# Patient Record
Sex: Female | Born: 1960 | Race: White | Hispanic: No | Marital: Married | Smoking: Never smoker
Health system: Southern US, Community
[De-identification: ages and names within clinical notes are randomized; demographics above are authoritative.]

## PROBLEM LIST (undated history)

## (undated) DIAGNOSIS — C801 Malignant (primary) neoplasm, unspecified: Secondary | ICD-10-CM

## (undated) DIAGNOSIS — Z860100 Personal history of colon polyps, unspecified: Secondary | ICD-10-CM

## (undated) DIAGNOSIS — E079 Disorder of thyroid, unspecified: Secondary | ICD-10-CM

## (undated) DIAGNOSIS — R42 Dizziness and giddiness: Secondary | ICD-10-CM

## (undated) DIAGNOSIS — D039 Melanoma in situ, unspecified: Secondary | ICD-10-CM

## (undated) HISTORY — DX: Dizziness and giddiness: R42

## (undated) HISTORY — DX: Disorder of thyroid, unspecified: E07.9

## (undated) HISTORY — DX: Personal history of colon polyps, unspecified: Z86.0100

## (undated) HISTORY — DX: Melanoma in situ, unspecified: D03.9

## (undated) HISTORY — DX: Malignant (primary) neoplasm, unspecified: C80.1

## (undated) HISTORY — DX: Personal history of colonic polyps: Z86.010

---
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
## 1992-11-15 HISTORY — PX: TUBAL LIGATION: SHX77

## 2005-05-31 ENCOUNTER — Ambulatory Visit: Payer: Self-pay | Admitting: Obstetrics and Gynecology

## 2006-06-17 ENCOUNTER — Ambulatory Visit: Payer: Self-pay | Admitting: Obstetrics and Gynecology

## 2007-03-14 ENCOUNTER — Ambulatory Visit: Payer: Self-pay | Admitting: Family Medicine

## 2008-03-08 ENCOUNTER — Ambulatory Visit: Payer: Self-pay

## 2009-08-22 ENCOUNTER — Ambulatory Visit: Payer: Self-pay | Admitting: Internal Medicine

## 2010-11-17 ENCOUNTER — Ambulatory Visit: Payer: Self-pay | Admitting: Family Medicine

## 2012-04-14 ENCOUNTER — Ambulatory Visit: Payer: Self-pay | Admitting: Family Medicine

## 2012-05-16 ENCOUNTER — Ambulatory Visit: Payer: Self-pay | Admitting: Unknown Physician Specialty

## 2013-02-02 ENCOUNTER — Ambulatory Visit (INDEPENDENT_AMBULATORY_CARE_PROVIDER_SITE_OTHER): Payer: PRIVATE HEALTH INSURANCE | Admitting: Internal Medicine

## 2013-02-02 ENCOUNTER — Other Ambulatory Visit (HOSPITAL_COMMUNITY)
Admission: RE | Admit: 2013-02-02 | Discharge: 2013-02-02 | Disposition: A | Discharge status: Home / Self Care | Payer: PRIVATE HEALTH INSURANCE | Source: Ambulatory Visit | Attending: Internal Medicine | Admitting: Internal Medicine

## 2013-02-02 ENCOUNTER — Ambulatory Visit: Payer: PRIVATE HEALTH INSURANCE

## 2013-02-02 ENCOUNTER — Encounter: Payer: Self-pay | Admitting: Internal Medicine

## 2013-02-02 VITALS — BP 112/80 | HR 64 | Temp 97.9°F | Ht 63.5 in | Wt 142.5 lb

## 2013-02-02 DIAGNOSIS — Z01419 Encounter for gynecological examination (general) (routine) without abnormal findings: Secondary | ICD-10-CM | POA: Insufficient documentation

## 2013-02-02 DIAGNOSIS — Z13220 Encounter for screening for lipoid disorders: Secondary | ICD-10-CM

## 2013-02-02 DIAGNOSIS — D039 Melanoma in situ, unspecified: Secondary | ICD-10-CM

## 2013-02-02 DIAGNOSIS — Z124 Encounter for screening for malignant neoplasm of cervix: Secondary | ICD-10-CM

## 2013-02-02 DIAGNOSIS — C439 Malignant melanoma of skin, unspecified: Secondary | ICD-10-CM

## 2013-02-02 DIAGNOSIS — Z1151 Encounter for screening for human papillomavirus (HPV): Secondary | ICD-10-CM | POA: Insufficient documentation

## 2013-02-02 DIAGNOSIS — Z86010 Personal history of colonic polyps: Secondary | ICD-10-CM

## 2013-02-02 LAB — CBC WITH DIFFERENTIAL/PLATELET
Basophils Absolute: 0 10*3/uL (ref 0.0–0.1)
Eosinophils Absolute: 0.1 10*3/uL (ref 0.0–0.7)
Lymphocytes Relative: 30 % (ref 12–46)
Lymphs Abs: 1.7 10*3/uL (ref 0.7–4.0)
Neutrophils Relative %: 62 % (ref 43–77)
Platelets: 171 10*3/uL (ref 150–400)
RBC: 4.78 MIL/uL (ref 3.87–5.11)
WBC: 5.7 10*3/uL (ref 4.0–10.5)

## 2013-02-02 LAB — COMPREHENSIVE METABOLIC PANEL
ALT: 24 U/L (ref 0–35)
Albumin: 4.6 g/dL (ref 3.5–5.2)
CO2: 28 mEq/L (ref 19–32)
Chloride: 103 mEq/L (ref 96–112)
GFR: 62.2 mL/min (ref >60.00)
Glucose, Bld: 84 mg/dL (ref 70–99)
Potassium: 4.3 mEq/L (ref 3.5–5.1)
Sodium: 138 mEq/L (ref 135–145)
Total Bilirubin: 0.9 mg/dL (ref 0.3–1.2)
Total Protein: 7.8 g/dL (ref 6.0–8.3)

## 2013-02-02 LAB — TSH: TSH: 3.04 u[IU]/mL (ref 0.35–5.50)

## 2013-02-02 LAB — LDL CHOLESTEROL, DIRECT: Direct LDL: 160.6 mg/dL

## 2013-02-02 LAB — LIPID PANEL: HDL: 63.5 mg/dL (ref >39.00)

## 2013-02-04 ENCOUNTER — Encounter: Payer: Self-pay | Admitting: Internal Medicine

## 2013-02-04 DIAGNOSIS — D039 Melanoma in situ, unspecified: Secondary | ICD-10-CM | POA: Insufficient documentation

## 2013-02-04 DIAGNOSIS — Z86010 Personal history of colonic polyps: Secondary | ICD-10-CM | POA: Insufficient documentation

## 2013-02-04 NOTE — Assessment & Plan Note (Signed)
Followed by Dr Graham.  

## 2013-02-04 NOTE — Progress Notes (Signed)
  Subjective:    Patient ID: Michelle Roach, female    DOB: 1961-07-19, 52 y.o.   MRN: [id number]  HPI 52 year old female with past history of melanoma in situ and colon polyps who comes in today to establish care and for her complete physical exam.  (daughter of Francesca Jewett).  She has previously been seeing Dr Quillian Quince.  States she has always had normal pap smears.  Due a physical.  Last mammo - fall 2013.  Followed by Dr Cheree Ditto for her melanoma in situ.  Stays active.  Works as a Armed forces operational officer.  No cardiac symptoms with increased activity or exertion.  No nausea or vomiting.  Bowels stable.  No period for four years.  Having some hot flashes.  Manages.  Exercises.     Past Medical History  Diagnosis Date  . Melanoma in situ     right leg  . History of colon polyps     Outpatient Encounter Prescriptions as of 02/02/2013  Medication Sig Dispense Refill  . aspirin 81 MG tablet Take 81 mg by mouth daily.       No facility-administered encounter medications on file as of 02/02/2013.    Review of Systems Patient denies any headache, lightheadedness or dizziness.  No sinus or allergy symptoms.  No chest pain, tightness or palpitations.  No increased shortness of breath, cough or congestion.  No nausea or vomiting.  No acid reflux.  No abdominal pain or cramping.  No bowel change, such as diarrhea, constipation, BRBPR or melana.  No urine change.  No vaginal bleeding or problems.  No period for four years.  Some hot flashes.       Objective:   Physical Exam Filed Vitals:   02/02/13 1023  BP: 112/80  Pulse: 64  Temp: 97.9 F (73.41 C)   52 year old female in no acute distress.   HEENT:  Nares- clear.  Oropharynx - without lesions. NECK:  Supple.  Nontender.  No audible bruit.  HEART:  Appears to be regular. LUNGS:  No crackles or wheezing audible.  Respirations even and unlabored.  RADIAL PULSE:  Equal bilaterally.    BREASTS:  No nipple discharge or nipple retraction present.  Could  not appreciate any distinct nodules or axillary adenopathy.  ABDOMEN:  Soft, nontender.  Bowel sounds present and normal.  No audible abdominal bruit.  GU:  Normal external genitalia.  Vaginal vault without lesions.  S/p hysterectomy.  Could not appreciate any adnexal masses or tenderness.   RECTAL:  Heme negative.   EXTREMITIES:  No increased edema present.  DP pulses palpable and equal bilaterally.          Assessment & Plan:  CARDIOVASCULAR.  Active with no cardiac symptoms.  Follow.   MENOPAUSAL SYNDROME.  Some hot flashes.  Symptoms manageable .  Follow.   HEALTH MAINTENANCE.  Physical today.  Obtain records from Dr Quillian Quince.  Up to date with mammograms.  Colonoscopy 11/13 - polyps.  Due follow up colonoscopy 2016.

## 2013-02-04 NOTE — Assessment & Plan Note (Signed)
Colonoscopy 09/2012 with pre cancerous polyps.  Recommended follow up colonoscopy in three years.    

## 2013-02-06 ENCOUNTER — Other Ambulatory Visit: Payer: Self-pay | Admitting: Internal Medicine

## 2013-02-06 DIAGNOSIS — E7800 Pure hypercholesterolemia, unspecified: Secondary | ICD-10-CM

## 2013-02-06 NOTE — Progress Notes (Signed)
Order placed for a follow up cholesterol check.

## 2013-02-12 ENCOUNTER — Encounter: Payer: Self-pay | Admitting: Internal Medicine

## 2013-02-12 DIAGNOSIS — Z86010 Personal history of colonic polyps: Secondary | ICD-10-CM

## 2013-06-06 ENCOUNTER — Telehealth: Payer: Self-pay | Admitting: Internal Medicine

## 2013-06-06 DIAGNOSIS — R238 Other skin changes: Secondary | ICD-10-CM

## 2013-06-06 NOTE — Telephone Encounter (Signed)
Pt notified & states that a previous doctor put her on ASA. She has not taking it in the last 2 days.

## 2013-06-06 NOTE — Telephone Encounter (Signed)
I have ordered the labs.  Per chart, she is on 81mg  aspirin.  Why is she taking this?  This possibly could be causing more bruising.

## 2013-06-06 NOTE — Telephone Encounter (Signed)
Pt is coming in for labs on Friday and is having unusal bruising that keeps coming up from no where. She was wondering if more labs could be done to see whats going on ??

## 2013-06-08 ENCOUNTER — Other Ambulatory Visit (INDEPENDENT_AMBULATORY_CARE_PROVIDER_SITE_OTHER): Payer: PRIVATE HEALTH INSURANCE

## 2013-06-08 DIAGNOSIS — R238 Other skin changes: Secondary | ICD-10-CM

## 2013-06-08 DIAGNOSIS — E7800 Pure hypercholesterolemia, unspecified: Secondary | ICD-10-CM

## 2013-06-08 LAB — CBC WITH DIFFERENTIAL/PLATELET
Basophils Relative: 0.6 % (ref 0.0–3.0)
Eosinophils Absolute: 0.1 10*3/uL (ref 0.0–0.7)
HCT: 42 % (ref 36.0–46.0)
Lymphs Abs: 1.5 10*3/uL (ref 0.7–4.0)
MCHC: 33.1 g/dL (ref 30.0–36.0)
MCV: 88.7 fl (ref 78.0–100.0)
Monocytes Absolute: 0.4 10*3/uL (ref 0.1–1.0)
Neutrophils Relative %: 61 % (ref 43.0–77.0)
RBC: 4.74 Mil/uL (ref 3.87–5.11)

## 2013-06-08 LAB — LIPID PANEL
HDL: 59.3 mg/dL (ref >39.00)
Triglycerides: 84 mg/dL (ref 0.0–149.0)

## 2013-06-08 LAB — LDL CHOLESTEROL, DIRECT: Direct LDL: 147 mg/dL

## 2013-06-08 LAB — PROTIME-INR
INR: 1.1 ratio — ABNORMAL HIGH (ref 0.8–1.0)
Prothrombin Time: 11.4 s (ref 10.2–12.4)

## 2013-06-14 ENCOUNTER — Encounter: Payer: Self-pay | Admitting: Internal Medicine

## 2013-06-15 ENCOUNTER — Other Ambulatory Visit: Payer: Self-pay | Admitting: Internal Medicine

## 2013-06-15 ENCOUNTER — Telehealth: Payer: Self-pay | Admitting: Internal Medicine

## 2013-06-15 ENCOUNTER — Encounter: Payer: Self-pay | Admitting: Internal Medicine

## 2013-06-15 DIAGNOSIS — R238 Other skin changes: Secondary | ICD-10-CM

## 2013-06-15 NOTE — Progress Notes (Signed)
Orders placed for ptt and von wilebrand panel

## 2013-06-15 NOTE — Telephone Encounter (Signed)
Appointment 8/20 sent pt my chart message letting her know about appointment

## 2013-06-15 NOTE — Telephone Encounter (Signed)
Pt notified of lab results and need for f/u non fasting lab within the next two weeks.  Please call and schedule her an appt for a non fasting lab wtihin the next two weeks.  Thanks.

## 2013-07-04 ENCOUNTER — Other Ambulatory Visit: Payer: PRIVATE HEALTH INSURANCE

## 2013-07-13 ENCOUNTER — Other Ambulatory Visit (INDEPENDENT_AMBULATORY_CARE_PROVIDER_SITE_OTHER): Payer: PRIVATE HEALTH INSURANCE

## 2013-07-13 DIAGNOSIS — R238 Other skin changes: Secondary | ICD-10-CM

## 2013-07-13 LAB — APTT: aPTT: 31.8 s — ABNORMAL HIGH (ref 21.7–28.8)

## 2013-07-17 LAB — VON WILLEBRAND PANEL
Coagulation Factor VIII: 46 % — ABNORMAL LOW (ref 73–140)
Ristocetin Co-factor, Plasma: 156 % (ref 42–200)
Von Willebrand Antigen, Plasma: 154 % (ref 50–217)

## 2013-07-26 ENCOUNTER — Telehealth: Payer: Self-pay | Admitting: *Deleted

## 2013-07-26 DIAGNOSIS — R791 Abnormal coagulation profile: Secondary | ICD-10-CM

## 2013-07-26 NOTE — Telephone Encounter (Signed)
Pt states that she was returning my call yesterday. I have not called her. Did you call her? Could be about the Von willebrand panel. Let me know if I need to call her

## 2013-07-26 NOTE — Telephone Encounter (Signed)
Pt notified of lab results.  Discussed with hematology (Dr Orlie Dakin).  Will refer to hematology.  Order for referral made.

## 2013-08-10 ENCOUNTER — Ambulatory Visit: Payer: Self-pay | Admitting: Oncology

## 2013-08-10 LAB — CBC CANCER CENTER
Basophil #: 0 x10 3/mm (ref 0.0–0.1)
Eosinophil #: 0.1 x10 3/mm (ref 0.0–0.7)
HGB: 13.9 g/dL (ref 12.0–16.0)
MCH: 29.7 pg (ref 26.0–34.0)
MCV: 89 fL (ref 80–100)
Monocyte #: 0.4 x10 3/mm (ref 0.2–0.9)
Monocyte %: 5.8 %
Neutrophil #: 5.7 x10 3/mm (ref 1.4–6.5)
Neutrophil %: 73.7 %
Platelet: 190 x10 3/mm (ref 150–440)
RBC: 4.68 10*6/uL (ref 3.80–5.20)

## 2013-08-10 LAB — PROTIME-INR: Prothrombin Time: 13 secs (ref 11.5–14.7)

## 2013-08-15 ENCOUNTER — Ambulatory Visit: Payer: Self-pay | Admitting: Oncology

## 2013-09-20 ENCOUNTER — Other Ambulatory Visit: Payer: Self-pay

## 2013-10-19 ENCOUNTER — Ambulatory Visit: Payer: Self-pay | Admitting: Internal Medicine

## 2013-10-22 ENCOUNTER — Encounter: Payer: Self-pay | Admitting: Internal Medicine

## 2013-12-04 ENCOUNTER — Encounter: Payer: Self-pay | Admitting: Internal Medicine

## 2013-12-07 ENCOUNTER — Telehealth: Payer: Self-pay | Admitting: *Deleted

## 2013-12-07 NOTE — Telephone Encounter (Signed)
rx written and in your box.  Pt aware going to be faxed.

## 2013-12-07 NOTE — Telephone Encounter (Signed)
Rx faxed to [facility] 

## 2013-12-07 NOTE — Telephone Encounter (Signed)
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 Pharmacy required a Rx because of her age (47)

## 2013-12-07 NOTE — Telephone Encounter (Signed)
Spoke to pt.  Discussed shingles vaccine and being <60.  Discussed efficacy unknown after 5-7 years.  She desires to get vaccine.  rx to be faxed.

## 2013-12-11 ENCOUNTER — Telehealth: Payer: Self-pay | Admitting: Internal Medicine

## 2013-12-11 NOTE — Telephone Encounter (Signed)
Needing a prior authorization for her shingle vaccine that was sent to the pharmacy.

## 2013-12-12 NOTE — Telephone Encounter (Signed)
PA# [phone]

## 2013-12-12 NOTE — Telephone Encounter (Signed)
Pt initiated-should receive a fax in the next 48-72 hours

## 2013-12-17 NOTE — Telephone Encounter (Signed)
Pt called to check status of shingles vaccine. Advised pt we are waiting to hear back from insurance company.  Advised pt we will call when we get the response from insurance regarding her shingles vaccine.

## 2013-12-17 NOTE — Telephone Encounter (Signed)
Noted, we had received the initial fax. Now we have to complete our part & fax it back. This process can take a total of 7-10 days from start to finish.

## 2013-12-20 ENCOUNTER — Encounter: Payer: Self-pay | Admitting: *Deleted

## 2013-12-24 NOTE — Telephone Encounter (Signed)
Mailed unread message to pt  

## 2014-01-04 ENCOUNTER — Encounter: Payer: Self-pay | Admitting: *Deleted

## 2014-01-16 ENCOUNTER — Telehealth: Payer: Self-pay | Admitting: Internal Medicine

## 2014-01-16 ENCOUNTER — Other Ambulatory Visit: Payer: Self-pay | Admitting: Internal Medicine

## 2014-01-16 DIAGNOSIS — E7800 Pure hypercholesterolemia, unspecified: Secondary | ICD-10-CM

## 2014-01-16 DIAGNOSIS — D039 Melanoma in situ, unspecified: Secondary | ICD-10-CM

## 2014-01-16 NOTE — Telephone Encounter (Signed)
I have placed the order for the labs.  Ok to schedule lab appt 1-2 days before physical.  Thanks.

## 2014-01-16 NOTE — Progress Notes (Signed)
Order placed for labs.

## 2014-01-16 NOTE — Telephone Encounter (Signed)
Pt called she has cpx 3/27 and would like to come in for labs prior to this appointment  Is it ok to schedule

## 2014-01-17 NOTE — Telephone Encounter (Signed)
Left message for pt to call office  Please let pt know about lab appointment 02/01/14 @ 8:30 Also sent my chart message

## 2014-02-01 ENCOUNTER — Ambulatory Visit (INDEPENDENT_AMBULATORY_CARE_PROVIDER_SITE_OTHER): Payer: PRIVATE HEALTH INSURANCE

## 2014-02-01 ENCOUNTER — Ambulatory Visit (INDEPENDENT_AMBULATORY_CARE_PROVIDER_SITE_OTHER): Payer: PRIVATE HEALTH INSURANCE | Admitting: Podiatry

## 2014-02-01 ENCOUNTER — Other Ambulatory Visit (INDEPENDENT_AMBULATORY_CARE_PROVIDER_SITE_OTHER): Payer: PRIVATE HEALTH INSURANCE

## 2014-02-01 ENCOUNTER — Encounter: Payer: Self-pay | Admitting: Podiatry

## 2014-02-01 VITALS — BP 121/68 | HR 55 | Resp 16 | Ht 64.0 in | Wt 145.0 lb

## 2014-02-01 DIAGNOSIS — M79609 Pain in unspecified limb: Secondary | ICD-10-CM

## 2014-02-01 DIAGNOSIS — M79673 Pain in unspecified foot: Secondary | ICD-10-CM

## 2014-02-01 DIAGNOSIS — C439 Malignant melanoma of skin, unspecified: Secondary | ICD-10-CM

## 2014-02-01 DIAGNOSIS — E7800 Pure hypercholesterolemia, unspecified: Secondary | ICD-10-CM

## 2014-02-01 DIAGNOSIS — D039 Melanoma in situ, unspecified: Secondary | ICD-10-CM

## 2014-02-01 DIAGNOSIS — M7750 Other enthesopathy of unspecified foot: Secondary | ICD-10-CM

## 2014-02-01 LAB — COMPREHENSIVE METABOLIC PANEL
ALT: 23 U/L (ref 0–35)
AST: 31 U/L (ref 0–37)
Albumin: 4.2 g/dL (ref 3.5–5.2)
Alkaline Phosphatase: 59 U/L (ref 39–117)
BILIRUBIN TOTAL: 0.6 mg/dL (ref 0.3–1.2)
BUN: 13 mg/dL (ref 6–23)
CO2: 28 mEq/L (ref 19–32)
Calcium: 9.1 mg/dL (ref 8.4–10.5)
Chloride: 106 mEq/L (ref 96–112)
Creatinine, Ser: 1 mg/dL (ref 0.4–1.2)
GFR: 61.96 mL/min (ref >60.00)
Glucose, Bld: 84 mg/dL (ref 70–99)
POTASSIUM: 4.4 meq/L (ref 3.5–5.1)
Sodium: 140 mEq/L (ref 135–145)
Total Protein: 6.8 g/dL (ref 6.0–8.3)

## 2014-02-01 LAB — LIPID PANEL
CHOL/HDL RATIO: 3
Cholesterol: 206 mg/dL — ABNORMAL HIGH (ref 0–200)
HDL: 63 mg/dL (ref >39.00)
LDL CALC: 133 mg/dL — AB (ref 0–99)
Triglycerides: 52 mg/dL (ref 0.0–149.0)
VLDL: 10.4 mg/dL (ref 0.0–40.0)

## 2014-02-01 MED ORDER — TRIAMCINOLONE ACETONIDE 10 MG/ML IJ SUSP
10.0000 mg | Freq: Once | INTRAMUSCULAR | Status: AC
  Administered 2014-02-01: 10 mg

## 2014-02-01 NOTE — Progress Notes (Signed)
   Subjective:    Patient ID: Michelle Roach, female    DOB: 1961-05-07, 53 y.o.   MRN: [id number]  HPI Comments: N burning L rt foot under ankle D 3 weeks O slowly C worse/better A exercising T 0   Foot Pain      Review of Systems  Musculoskeletal:       Joint pain Difficulty walking  Hematological: Bruises/bleeds easily.  All other systems reviewed and are negative.       Objective:   Physical Exam        Assessment & Plan:

## 2014-02-01 NOTE — Progress Notes (Signed)
Subjective:     Patient ID: Michelle Roach, female   DOB: 1961/06/06, 53 y.o.   MRN: [id number]  Foot Pain   patient presents stating I am  having pain under my right ankle after being real active on it. Has orthotics from a number of years ago which have not been lifting her arch properly. It has been hurting for about 3 months   Review of Systems  All other systems reviewed and are negative.       Objective:   Physical Exam  Nursing note and vitals reviewed. Constitutional: She is oriented to person, place, and time.  Cardiovascular: Intact distal pulses.   Musculoskeletal: Normal range of motion.  Neurological: She is oriented to person, place, and time.  Skin: Skin is warm.   neurovascular status is found to be intact with inflammation and pain under the right ankle near the posterior tibial tendon muscle strength was found to be adequate of the posterior tibial tendon and other muscle groups and capsule time the digit was normal. Depression of the arch noted on weight    Assessment:     Foot structural issues with tendinitis of the posterior tibial tendon with no indications currently of tear    Plan:     Education and H&P and x-rays reviewed with patient. Today I did a careful sheath injection of the tendon applied fascially brace to with the arch advised him reduced activity and scanned for custom orthotic devices to reduce pressure against the tendon itself. Reappoint to recheck

## 2014-02-03 ENCOUNTER — Encounter: Payer: Self-pay | Admitting: Internal Medicine

## 2014-02-08 ENCOUNTER — Ambulatory Visit (INDEPENDENT_AMBULATORY_CARE_PROVIDER_SITE_OTHER): Payer: PRIVATE HEALTH INSURANCE | Admitting: Internal Medicine

## 2014-02-08 ENCOUNTER — Encounter: Payer: Self-pay | Admitting: Internal Medicine

## 2014-02-08 VITALS — BP 110/60 | HR 64 | Temp 98.4°F | Ht 64.0 in | Wt 148.5 lb

## 2014-02-08 DIAGNOSIS — M25579 Pain in unspecified ankle and joints of unspecified foot: Secondary | ICD-10-CM

## 2014-02-08 DIAGNOSIS — H539 Unspecified visual disturbance: Secondary | ICD-10-CM

## 2014-02-08 DIAGNOSIS — M25571 Pain in right ankle and joints of right foot: Secondary | ICD-10-CM

## 2014-02-08 DIAGNOSIS — C439 Malignant melanoma of skin, unspecified: Secondary | ICD-10-CM

## 2014-02-08 DIAGNOSIS — Z86010 Personal history of colonic polyps: Secondary | ICD-10-CM

## 2014-02-08 DIAGNOSIS — D039 Melanoma in situ, unspecified: Secondary | ICD-10-CM

## 2014-02-08 NOTE — Progress Notes (Signed)
Pre-visit discussion using our clinic review tool. No additional management support is needed unless otherwise documented below in the visit note.  

## 2014-02-08 NOTE — Progress Notes (Signed)
Subjective:    Patient ID: Michelle Roach, female    DOB: [birth year], 53 y.o.   MRN: [id number]  HPI 53 year old female with past history of melanoma in situ and colon polyps who comes in today for her complete physical exam.   (daughter of Fredrich Birks).  States she has always had normal pap smears.  Followed by Dr Phillip Heal for her melanoma in situ.  Stays active.  Works as a Copywriter, advertising.  No cardiac symptoms with increased activity or exertion.  No nausea or vomiting.  Bowels stable.  No period for four years.  Having some hot flashes.  Manages.  Exercises.  Was having problems with her right ankle.  Saw Dr Paulla Dolly.  Is s/p cortisone injection and due to get her new orthotics soon.  Ankle is better.  She has also noticed some change with her vision when she has been looking down and then looks up.  She denies any actual vision loss.  Only occurs with these eye movements.      Past Medical History  Diagnosis Date  . Melanoma in situ     right leg  . History of colon polyps     Outpatient Encounter Prescriptions as of 02/08/2014  Medication Sig  . aspirin 81 MG tablet Take 81 mg by mouth daily.  Marland Kitchen glucosamine-chondroitin 500-400 MG tablet Take 1 tablet by mouth daily.  . Multiple Vitamin (MULTIVITAMIN) tablet Take 1 tablet by mouth daily.  . niacin 500 MG tablet Take 500 mg by mouth at bedtime.  . [DISCONTINUED] niacin 50 MG tablet Take 50 mg by mouth at bedtime.    Review of Systems Patient denies any headache, lightheadedness or dizziness.  Vision change as outlined above.  No sinus or allergy symptoms.  No chest pain, tightness or palpitations.  No increased shortness of breath, cough or congestion.  No nausea or vomiting.  No acid reflux.  No abdominal pain or cramping.  No bowel change, such as diarrhea, constipation, BRBPR or melana.  No urine change.  No vaginal bleeding or problems.  No period for four years.  Some hot flashes.  Right ankle pain  - better.  Due to get orthotics  soon.        Objective:   Physical Exam  Filed Vitals:   02/08/14 1047  BP: 110/60  Pulse: 64  Temp: 98.4 F (71.45 C)   53 year old female in no acute distress.   HEENT:  Nares- clear.  Oropharynx - without lesions. NECK:  Supple.  Nontender.  No audible bruit.  HEART:  Appears to be regular. LUNGS:  No crackles or wheezing audible.  Respirations even and unlabored.  RADIAL PULSE:  Equal bilaterally.    BREASTS:  No nipple discharge or nipple retraction present.  Could not appreciate any distinct nodules or axillary adenopathy.  ABDOMEN:  Soft, nontender.  Bowel sounds present and normal.  No audible abdominal bruit.  GU:  Not perfromed.     EXTREMITIES:  No increased edema present.  DP pulses palpable and equal bilaterally.          Assessment & Plan:  CARDIOVASCULAR.  Active with no cardiac symptoms.  Follow.   MENOPAUSAL SYNDROME.  Some hot flashes.  Symptoms manageable .  Follow.   HEALTH MAINTENANCE.  Physical today.  Schedule mammogram.  Colonoscopy 11/13 - polyps.  Due follow up colonoscopy 2016.    I spent 25 minutes with the patient and more than 50% of the time was  spent in consultation regarding the above.

## 2014-02-10 ENCOUNTER — Encounter: Payer: Self-pay | Admitting: Internal Medicine

## 2014-02-10 DIAGNOSIS — M25571 Pain in right ankle and joints of right foot: Secondary | ICD-10-CM | POA: Insufficient documentation

## 2014-02-10 DIAGNOSIS — H539 Unspecified visual disturbance: Secondary | ICD-10-CM | POA: Insufficient documentation

## 2014-02-10 NOTE — Assessment & Plan Note (Signed)
Colonoscopy 09/2012 with pre cancerous polyps.  Recommended follow up colonoscopy in three years.

## 2014-02-10 NOTE — Assessment & Plan Note (Signed)
Has had ankle pain and swelling.  Saw Dr Paulla Dolly.  S/p cortisone injection.  Planning to get her new orthotics soon.  Better.  Follow.

## 2014-02-10 NOTE — Assessment & Plan Note (Signed)
Followed by Dr Graham.  

## 2014-02-10 NOTE — Assessment & Plan Note (Signed)
Only notices when she has been looking down and then looks up.  No actual vision loss.  Refer to opthalmology for further evaluation.  Hold on scanning.  Aspirin daily.

## 2014-02-13 ENCOUNTER — Encounter: Payer: Self-pay | Admitting: *Deleted

## 2014-02-13 NOTE — Progress Notes (Signed)
Sent pt post card letting her know orthotics are here. 

## 2014-02-22 ENCOUNTER — Ambulatory Visit (INDEPENDENT_AMBULATORY_CARE_PROVIDER_SITE_OTHER): Payer: PRIVATE HEALTH INSURANCE | Admitting: Podiatry

## 2014-02-22 ENCOUNTER — Ambulatory Visit: Payer: PRIVATE HEALTH INSURANCE

## 2014-02-22 DIAGNOSIS — M7750 Other enthesopathy of unspecified foot: Secondary | ICD-10-CM

## 2014-02-22 NOTE — Patient Instructions (Signed)

## 2014-02-22 NOTE — Progress Notes (Signed)
Pt presents for orthotic pick up , verbal and written instructions given

## 2014-03-08 ENCOUNTER — Ambulatory Visit (INDEPENDENT_AMBULATORY_CARE_PROVIDER_SITE_OTHER): Payer: PRIVATE HEALTH INSURANCE | Admitting: *Deleted

## 2014-03-08 DIAGNOSIS — Z23 Encounter for immunization: Secondary | ICD-10-CM

## 2014-03-08 DIAGNOSIS — Z2911 Encounter for prophylactic immunotherapy for respiratory syncytial virus (RSV): Secondary | ICD-10-CM

## 2014-03-15 ENCOUNTER — Ambulatory Visit: Payer: PRIVATE HEALTH INSURANCE | Admitting: Podiatry

## 2014-08-30 ENCOUNTER — Other Ambulatory Visit: Payer: Self-pay

## 2014-11-15 HISTORY — PX: COLONOSCOPY: SHX174

## 2015-02-14 ENCOUNTER — Ambulatory Visit: Admit: 2015-02-14 | Disposition: A | Discharge status: Home / Self Care | Payer: Self-pay | Admitting: Internal Medicine

## 2015-02-14 ENCOUNTER — Encounter: Payer: Self-pay | Admitting: *Deleted

## 2015-02-14 ENCOUNTER — Other Ambulatory Visit (INDEPENDENT_AMBULATORY_CARE_PROVIDER_SITE_OTHER): Payer: PRIVATE HEALTH INSURANCE

## 2015-02-14 ENCOUNTER — Telehealth: Payer: Self-pay | Admitting: Internal Medicine

## 2015-02-14 DIAGNOSIS — D039 Melanoma in situ, unspecified: Secondary | ICD-10-CM | POA: Diagnosis not present

## 2015-02-14 DIAGNOSIS — Z13220 Encounter for screening for lipoid disorders: Secondary | ICD-10-CM

## 2015-02-14 LAB — COMPREHENSIVE METABOLIC PANEL
ALBUMIN: 4.4 g/dL (ref 3.5–5.2)
ALT: 17 U/L (ref 0–35)
AST: 30 U/L (ref 0–37)
Alkaline Phosphatase: 64 U/L (ref 39–117)
BUN: 16 mg/dL (ref 6–23)
CALCIUM: 9.4 mg/dL (ref 8.4–10.5)
CO2: 28 meq/L (ref 19–32)
Chloride: 104 mEq/L (ref 96–112)
Creatinine, Ser: 1 mg/dL (ref 0.40–1.20)
GFR: 61.02 mL/min (ref >60.00)
Glucose, Bld: 101 mg/dL — ABNORMAL HIGH (ref 70–99)
POTASSIUM: 3.7 meq/L (ref 3.5–5.1)
SODIUM: 138 meq/L (ref 135–145)
Total Bilirubin: 0.5 mg/dL (ref 0.2–1.2)
Total Protein: 7 g/dL (ref 6.0–8.3)

## 2015-02-14 LAB — CBC WITH DIFFERENTIAL/PLATELET
BASOS PCT: 0.5 % (ref 0.0–3.0)
Basophils Absolute: 0 10*3/uL (ref 0.0–0.1)
Eosinophils Absolute: 0.2 10*3/uL (ref 0.0–0.7)
Eosinophils Relative: 2.2 % (ref 0.0–5.0)
HCT: 42 % (ref 36.0–46.0)
Hemoglobin: 14 g/dL (ref 12.0–15.0)
Lymphocytes Relative: 39.7 % (ref 12.0–46.0)
Lymphs Abs: 2.8 10*3/uL (ref 0.7–4.0)
MCHC: 33.4 g/dL (ref 30.0–36.0)
MCV: 88.5 fl (ref 78.0–100.0)
Monocytes Absolute: 0.5 10*3/uL (ref 0.1–1.0)
Monocytes Relative: 7.1 % (ref 3.0–12.0)
NEUTROS PCT: 50.5 % (ref 43.0–77.0)
Neutro Abs: 3.5 10*3/uL (ref 1.4–7.7)
Platelets: 179 10*3/uL (ref 150.0–400.0)
RBC: 4.75 Mil/uL (ref 3.87–5.11)
RDW: 14 % (ref 11.5–15.5)
WBC: 7 10*3/uL (ref 4.0–10.5)

## 2015-02-14 LAB — TSH: TSH: 7.96 u[IU]/mL — ABNORMAL HIGH (ref 0.35–4.50)

## 2015-02-14 LAB — LIPID PANEL
Cholesterol: 235 mg/dL — ABNORMAL HIGH (ref 0–200)
HDL: 63.2 mg/dL (ref >39.00)
LDL Cholesterol: 139 mg/dL — ABNORMAL HIGH (ref 0–99)
NONHDL: 171.8
Total CHOL/HDL Ratio: 4
Triglycerides: 166 mg/dL — ABNORMAL HIGH (ref 0.0–149.0)
VLDL: 33.2 mg/dL (ref 0.0–40.0)

## 2015-02-14 LAB — HM MAMMOGRAPHY: HM MAMMO: NEGATIVE

## 2015-02-14 NOTE — Telephone Encounter (Signed)
Orders placed for labs

## 2015-02-14 NOTE — Telephone Encounter (Signed)
Lab and dx?

## 2015-02-14 NOTE — Telephone Encounter (Signed)
Pt came in for lab appt that she states was made last year as well as a physical appt on 4/8. Pt was advised that there was not lab app in the system and the physical appt was cancelled last year on 4/29. Pt stated that she didn't cancel the appt and needs to be seen on 4/8 for a physical. Pt advise of the next available appt on 6/2 at 9:30. Pt stated that she didn't cancel the appt and that this office cancel the appt by mistake. Pt demanded to be added to the lab schedule today and that Dr. Nicki Reaper can look at her labs and advise her of the result. Please advise scheduling an appt at a sooner date and time.msn

## 2015-02-17 ENCOUNTER — Encounter: Payer: Self-pay | Admitting: Internal Medicine

## 2015-02-21 ENCOUNTER — Other Ambulatory Visit (HOSPITAL_COMMUNITY)
Admission: RE | Admit: 2015-02-21 | Discharge: 2015-02-21 | Disposition: A | Discharge status: Home / Self Care | Payer: PRIVATE HEALTH INSURANCE | Source: Ambulatory Visit | Attending: Internal Medicine | Admitting: Internal Medicine

## 2015-02-21 ENCOUNTER — Encounter: Payer: Self-pay | Admitting: Internal Medicine

## 2015-02-21 ENCOUNTER — Encounter: Payer: PRIVATE HEALTH INSURANCE | Admitting: Internal Medicine

## 2015-02-21 ENCOUNTER — Ambulatory Visit (INDEPENDENT_AMBULATORY_CARE_PROVIDER_SITE_OTHER): Payer: PRIVATE HEALTH INSURANCE | Admitting: Internal Medicine

## 2015-02-21 VITALS — BP 130/70 | HR 68 | Temp 98.0°F | Ht 64.0 in | Wt 143.2 lb

## 2015-02-21 DIAGNOSIS — Z86010 Personal history of colonic polyps: Secondary | ICD-10-CM

## 2015-02-21 DIAGNOSIS — E7800 Pure hypercholesterolemia, unspecified: Secondary | ICD-10-CM

## 2015-02-21 DIAGNOSIS — Z0000 Encounter for general adult medical examination without abnormal findings: Secondary | ICD-10-CM | POA: Diagnosis not present

## 2015-02-21 DIAGNOSIS — Z124 Encounter for screening for malignant neoplasm of cervix: Secondary | ICD-10-CM

## 2015-02-21 DIAGNOSIS — Z01419 Encounter for gynecological examination (general) (routine) without abnormal findings: Secondary | ICD-10-CM | POA: Diagnosis present

## 2015-02-21 DIAGNOSIS — D039 Melanoma in situ, unspecified: Secondary | ICD-10-CM

## 2015-02-21 DIAGNOSIS — Z1151 Encounter for screening for human papillomavirus (HPV): Secondary | ICD-10-CM | POA: Diagnosis present

## 2015-02-21 DIAGNOSIS — M7989 Other specified soft tissue disorders: Secondary | ICD-10-CM

## 2015-02-21 NOTE — Progress Notes (Signed)
Patient ID: Michelle Roach, female   DOB: [birth year], 54 y.o.   MRN: [id number]   Subjective:    Patient ID: Michelle Roach, female    DOB: [birth year], 54 y.o.   MRN: [id number]  HPI  Patient here for her physical.  States she is doing relatively well.  Is concerned about some nodules she has felt on her left upper thigh, left lower quadrant abdomen and left lower back.  Has a history of melanoma in situ.  States would like these evaluated.  Has been seeing Dr Kellie Moor every year.  Stays active.  No cardiac symptoms with increased activity or exertion.  Breathing stable.     Past Medical History  Diagnosis Date  . Melanoma in situ     right leg  . History of colon polyps     Current Outpatient Prescriptions on File Prior to Visit  Medication Sig Dispense Refill  . aspirin 81 MG tablet Take 81 mg by mouth daily.    . Multiple Vitamin (MULTIVITAMIN) tablet Take 1 tablet by mouth daily.     No current facility-administered medications on file prior to visit.    Review of Systems  Constitutional: Negative for appetite change and unexpected weight change.  HENT: Negative for congestion and sinus pressure.   Eyes: Negative for pain and visual disturbance.  Respiratory: Negative for cough, chest tightness and shortness of breath.   Cardiovascular: Negative for chest pain, palpitations and leg swelling.  Gastrointestinal: Negative for nausea, vomiting, abdominal pain and diarrhea.  Genitourinary: Negative for dysuria and difficulty urinating.  Musculoskeletal: Negative for back pain and joint swelling.  Skin: Negative for color change and rash.       Reports nodules as outlined.   Neurological: Negative for dizziness, light-headedness and headaches.  Hematological: Negative for adenopathy. Does not bruise/bleed easily.  Psychiatric/Behavioral: Negative for dysphoric mood and agitation.       Objective:     Blood pressure recheck:  128/78  Physical Exam  Constitutional: She is  oriented to person, place, and time. She appears well-developed and well-nourished.  HENT:  Nose: Nose normal.  Mouth/Throat: Oropharynx is clear and moist.  Eyes: Right eye exhibits no discharge. Left eye exhibits no discharge. No scleral icterus.  Neck: Neck supple. No thyromegaly present.  Cardiovascular: Normal rate and regular rhythm.   Pulmonary/Chest: Breath sounds normal. No accessory muscle usage. No tachypnea. No respiratory distress. She has no decreased breath sounds. She has no wheezes. She has no rhonchi. Right breast exhibits no inverted nipple, no mass, no nipple discharge and no tenderness (no axillary adenopathy). Left breast exhibits no inverted nipple, no mass, no nipple discharge and no tenderness (no axilarry adenopathy).  Abdominal: Soft. Bowel sounds are normal. There is no tenderness.  Genitourinary:  Normal external genitalia.  Vaginal vault without lesions.  Cervix identified.  Pap smear performed.  Could not appreciate any adnexal masses or tenderness.    Musculoskeletal: She exhibits no edema or tenderness.  Lymphadenopathy:    She has no cervical adenopathy.  Neurological: She is alert and oriented to person, place, and time.  Skin: Skin is warm. No rash noted.  Psychiatric: She has a normal mood and affect. Her behavior is normal.    BP 130/70 mmHg  Pulse 68  Temp(Src) 98 F (36.7 C) (Oral)  Ht 5\' 4"  (1.626 m)  Wt 143 lb 4 oz (64.978 kg)  BMI 24.58 kg/m2  SpO2 98% Wt Readings from Last 3 Encounters:  02/21/15 143 lb 4 oz (  64.978 kg)  02/08/14 148 lb 8 oz (67.359 kg)  02/01/14 145 lb (65.772 kg)     Lab Results  Component Value Date   WBC 7.0 02/14/2015   HGB 14.0 02/14/2015   HCT 42.0 02/14/2015   PLT 179.0 02/14/2015   GLUCOSE 101* 02/14/2015   CHOL 235* 02/14/2015   TRIG 166.0* 02/14/2015   HDL 63.20 02/14/2015   LDLDIRECT 147.0 06/08/2013   LDLCALC 139* 02/14/2015   ALT 17 02/14/2015   AST 30 02/14/2015   NA 138 02/14/2015   K 3.7  02/14/2015   CL 104 02/14/2015   CREATININE 1.00 02/14/2015   BUN 16 02/14/2015   CO2 28 02/14/2015   TSH 7.96* 02/14/2015   INR 1.1* 06/08/2013       Assessment & Plan:   Problem List Items Addressed This Visit    Health care maintenance - Primary    Physical today.  Colonoscopy 2013.  Recommended f/u colonoscopy in 05/2015.  PAP 02/21/15.  02/14/15 mammogram - [facility].       History of colonic polyps    Colonoscopy 05/16/12 - one 20mm polyp in the mid ascending colon, internal hemorrhoids, otherwise normal.  (serrated adenoma).  Recommended f/u colonoscopy in 05/2015.        Hypercholesterolemia    Low cholesterol diet and exercise.  Follow lipid panel.  Recheck in 6 months.        Relevant Orders   Lipid panel   Melanoma in situ    Has been followed yearly.  Has the soft tissue nodules on her left upper thigh, left lower quadrant and left lower back.  Request referral to surgery - [facility].        Relevant Orders   Ambulatory referral to General Surgery    Other Visit Diagnoses    Pap smear for cervical cancer screening        Relevant Orders    Cytology - PAP (Completed)    Nodule of soft tissue        Relevant Orders    Ambulatory referral to General Surgery        [name], MD

## 2015-02-21 NOTE — Progress Notes (Signed)
Pre visit review using our clinic review tool, if applicable. No additional management support is needed unless otherwise documented below in the visit note. 

## 2015-02-24 ENCOUNTER — Encounter: Payer: Self-pay | Admitting: Internal Medicine

## 2015-02-24 LAB — CYTOLOGY - PAP

## 2015-02-26 NOTE — Telephone Encounter (Signed)
Unread mychart message mailed to patient 

## 2015-03-02 ENCOUNTER — Encounter: Payer: Self-pay | Admitting: Internal Medicine

## 2015-03-02 DIAGNOSIS — E7800 Pure hypercholesterolemia, unspecified: Secondary | ICD-10-CM | POA: Insufficient documentation

## 2015-03-02 DIAGNOSIS — Z0000 Encounter for general adult medical examination without abnormal findings: Secondary | ICD-10-CM | POA: Insufficient documentation

## 2015-03-02 NOTE — Assessment & Plan Note (Addendum)
Physical today.  Colonoscopy 2013.  Recommended f/u colonoscopy in 05/2015.  PAP 02/21/15.  02/14/15 mammogram - Birads I.

## 2015-03-02 NOTE — Assessment & Plan Note (Signed)
Low cholesterol diet and exercise.  Follow lipid panel.  Recheck in 6 months.

## 2015-03-02 NOTE — Assessment & Plan Note (Signed)
Has been followed yearly.  Has the soft tissue nodules on her left upper thigh, left lower quadrant and left lower back.  Request referral to surgery - [facility].

## 2015-03-02 NOTE — Assessment & Plan Note (Signed)
Colonoscopy 05/16/12 - one 5mm polyp in the mid ascending colon, internal hemorrhoids, otherwise normal.  (serrated adenoma).  Recommended f/u colonoscopy in 05/2015.

## 2015-03-11 ENCOUNTER — Encounter: Payer: Self-pay | Admitting: Internal Medicine

## 2015-03-28 ENCOUNTER — Other Ambulatory Visit (INDEPENDENT_AMBULATORY_CARE_PROVIDER_SITE_OTHER): Payer: PRIVATE HEALTH INSURANCE

## 2015-03-28 ENCOUNTER — Other Ambulatory Visit: Payer: Self-pay | Admitting: Internal Medicine

## 2015-03-28 DIAGNOSIS — E7800 Pure hypercholesterolemia, unspecified: Secondary | ICD-10-CM

## 2015-03-28 DIAGNOSIS — R7989 Other specified abnormal findings of blood chemistry: Secondary | ICD-10-CM

## 2015-03-28 DIAGNOSIS — R946 Abnormal results of thyroid function studies: Secondary | ICD-10-CM

## 2015-03-28 LAB — TSH: TSH: 2.9 u[IU]/mL (ref 0.35–4.50)

## 2015-03-28 NOTE — Progress Notes (Signed)
Order placed for f/u tsh.  

## 2015-03-29 ENCOUNTER — Encounter: Payer: Self-pay | Admitting: Internal Medicine

## 2015-03-31 NOTE — Telephone Encounter (Signed)
Unread mychart message mailed to patient 

## 2015-06-20 ENCOUNTER — Other Ambulatory Visit: Payer: PRIVATE HEALTH INSURANCE

## 2015-07-20 ENCOUNTER — Encounter: Payer: Self-pay | Admitting: Internal Medicine

## 2015-09-05 ENCOUNTER — Other Ambulatory Visit (INDEPENDENT_AMBULATORY_CARE_PROVIDER_SITE_OTHER): Payer: PRIVATE HEALTH INSURANCE

## 2015-09-05 ENCOUNTER — Telehealth: Payer: Self-pay | Admitting: *Deleted

## 2015-09-05 ENCOUNTER — Encounter: Payer: Self-pay | Admitting: Internal Medicine

## 2015-09-05 DIAGNOSIS — R7989 Other specified abnormal findings of blood chemistry: Secondary | ICD-10-CM

## 2015-09-05 DIAGNOSIS — E7800 Pure hypercholesterolemia, unspecified: Secondary | ICD-10-CM

## 2015-09-05 DIAGNOSIS — R946 Abnormal results of thyroid function studies: Secondary | ICD-10-CM | POA: Diagnosis not present

## 2015-09-05 LAB — COMPREHENSIVE METABOLIC PANEL
ALT: 15 U/L (ref 0–35)
AST: 20 U/L (ref 0–37)
Albumin: 4.3 g/dL (ref 3.5–5.2)
Alkaline Phosphatase: 62 U/L (ref 39–117)
BUN: 19 mg/dL (ref 6–23)
CO2: 28 mEq/L (ref 19–32)
Calcium: 9.6 mg/dL (ref 8.4–10.5)
Chloride: 106 mEq/L (ref 96–112)
Creatinine, Ser: 0.96 mg/dL (ref 0.40–1.20)
GFR: 63.83 mL/min (ref >60.00)
Glucose, Bld: 69 mg/dL — ABNORMAL LOW (ref 70–99)
Potassium: 5 mEq/L (ref 3.5–5.1)
Sodium: 142 mEq/L (ref 135–145)
Total Bilirubin: 0.5 mg/dL (ref 0.2–1.2)
Total Protein: 7 g/dL (ref 6.0–8.3)

## 2015-09-05 LAB — LIPID PANEL
Cholesterol: 218 mg/dL — ABNORMAL HIGH (ref 0–200)
HDL: 67.9 mg/dL (ref >39.00)
LDL Cholesterol: 129 mg/dL — ABNORMAL HIGH (ref 0–99)
NonHDL: 149.98
Total CHOL/HDL Ratio: 3
Triglycerides: 104 mg/dL (ref 0.0–149.0)
VLDL: 20.8 mg/dL (ref 0.0–40.0)

## 2015-09-05 LAB — TSH: TSH: 4.23 u[IU]/mL (ref 0.35–4.50)

## 2015-09-05 NOTE — Telephone Encounter (Signed)
Labs and dx?  

## 2015-09-05 NOTE — Telephone Encounter (Signed)
Orders placed for labs

## 2015-09-08 ENCOUNTER — Encounter: Payer: Self-pay | Admitting: Internal Medicine

## 2015-09-10 NOTE — Telephone Encounter (Signed)
Unread mychart message mailed to patient 

## 2015-09-22 ENCOUNTER — Telehealth: Payer: Self-pay | Admitting: Internal Medicine

## 2015-09-22 NOTE — Telephone Encounter (Signed)
Silva Bandy with [facility] called to state that she had spoken with Dr. Ruby Cola and she believes the patient would be best to be seen by dermatology. Pt has a hx of Melanoma with subq nodules over 81yrs ago. Phyllis recommended [facility] or [facility] [phone]). Silva Bandy advised the pt would need to be referred in for an appt. She stated that appts are out for the next few months. Silva Bandy will contact the pt to advise of the above. Please read last referral note in chart for more information.msn

## 2015-09-22 NOTE — Telephone Encounter (Signed)
Please notify pt of above information and see if she wants me to place the order for the referral and if she has a preference of one of these two physicians.

## 2015-09-22 NOTE — Telephone Encounter (Signed)
Pt states that the dermatologist called her on Friday & said they didn't feel that she needed to see then either. Pt also states that she has seen Lew Dawes before & doesn't care to see her again. So she states that we are back where we started. Please advise.

## 2015-09-23 NOTE — Telephone Encounter (Signed)
LVTCB

## 2015-09-23 NOTE — Telephone Encounter (Signed)
She would like you to explain more in depth why she needs to see a dermatologist

## 2015-09-23 NOTE — Telephone Encounter (Signed)
I still agree with seeing dermatology.  Would she be agreeable to see someone in town [facility]) or the other physician mentioned - Dr Philemon Kingdom.  Just let me know and I will place the order.

## 2015-09-23 NOTE — Telephone Encounter (Signed)
She has one his name is Michelle Roach.

## 2015-09-24 NOTE — Telephone Encounter (Signed)
Notified patient of these comments. She states that she will just follow up with dermatologist in few weeks since her next appt with Dr. Nicki Reaper isn't until April. Thanks!

## 2015-09-24 NOTE — Telephone Encounter (Signed)
Usually pts with a history of melanoma (or melanoma in situ) see a dermatologist for routine follow up.  If she desires not to go, we can discuss this more at her next appt.

## 2015-09-25 NOTE — Telephone Encounter (Signed)
Patient states that she has an appt with dermatologist scheduled.

## 2015-09-25 NOTE — Telephone Encounter (Signed)
Ok.  Just need to clarify - does she have an appt with the dermatologist already scheduled or does she need appt scheduled.  Just let me know if I need to do anything.  Thanks

## 2016-04-02 ENCOUNTER — Other Ambulatory Visit (INDEPENDENT_AMBULATORY_CARE_PROVIDER_SITE_OTHER): Payer: PRIVATE HEALTH INSURANCE

## 2016-04-02 ENCOUNTER — Telehealth: Payer: Self-pay | Admitting: *Deleted

## 2016-04-02 DIAGNOSIS — E7800 Pure hypercholesterolemia, unspecified: Secondary | ICD-10-CM | POA: Diagnosis not present

## 2016-04-02 LAB — COMPREHENSIVE METABOLIC PANEL
ALBUMIN: 4 g/dL (ref 3.5–5.2)
ALK PHOS: 57 U/L (ref 39–117)
ALT: 34 U/L (ref 0–35)
AST: 29 U/L (ref 0–37)
BUN: 16 mg/dL (ref 6–23)
CALCIUM: 9.1 mg/dL (ref 8.4–10.5)
CHLORIDE: 107 meq/L (ref 96–112)
CO2: 28 mEq/L (ref 19–32)
Creatinine, Ser: 0.75 mg/dL (ref 0.40–1.20)
GFR: 84.7 mL/min (ref >60.00)
Glucose, Bld: 82 mg/dL (ref 70–99)
POTASSIUM: 4.2 meq/L (ref 3.5–5.1)
Sodium: 142 mEq/L (ref 135–145)
TOTAL PROTEIN: 6.1 g/dL (ref 6.0–8.3)
Total Bilirubin: 0.5 mg/dL (ref 0.2–1.2)

## 2016-04-02 LAB — CBC WITH DIFFERENTIAL/PLATELET
BASOS PCT: 0.5 % (ref 0.0–3.0)
Basophils Absolute: 0 10*3/uL (ref 0.0–0.1)
EOS ABS: 0.1 10*3/uL (ref 0.0–0.7)
Eosinophils Relative: 1.4 % (ref 0.0–5.0)
HEMATOCRIT: 40.1 % (ref 36.0–46.0)
HEMOGLOBIN: 13.4 g/dL (ref 12.0–15.0)
LYMPHS PCT: 29.8 % (ref 12.0–46.0)
Lymphs Abs: 1.5 10*3/uL (ref 0.7–4.0)
MCHC: 33.4 g/dL (ref 30.0–36.0)
MCV: 85.6 fl (ref 78.0–100.0)
MONO ABS: 0.5 10*3/uL (ref 0.1–1.0)
Monocytes Relative: 9.1 % (ref 3.0–12.0)
Neutro Abs: 3.1 10*3/uL (ref 1.4–7.7)
Neutrophils Relative %: 59.2 % (ref 43.0–77.0)
Platelets: 159 10*3/uL (ref 150.0–400.0)
RBC: 4.68 Mil/uL (ref 3.87–5.11)
RDW: 13.8 % (ref 11.5–15.5)
WBC: 5.2 10*3/uL (ref 4.0–10.5)

## 2016-04-02 LAB — LIPID PANEL
CHOLESTEROL: 169 mg/dL (ref 0–200)
HDL: 55.5 mg/dL (ref >39.00)
LDL CALC: 97 mg/dL (ref 0–99)
NonHDL: 113.83
TRIGLYCERIDES: 82 mg/dL (ref 0.0–149.0)
Total CHOL/HDL Ratio: 3
VLDL: 16.4 mg/dL (ref 0.0–40.0)

## 2016-04-02 LAB — TSH: TSH: 0.03 u[IU]/mL — AB (ref 0.35–4.50)

## 2016-04-02 NOTE — Telephone Encounter (Signed)
Labs and dx?  

## 2016-04-02 NOTE — Telephone Encounter (Signed)
Orders placed for labs

## 2016-04-06 ENCOUNTER — Encounter: Payer: Self-pay | Admitting: Internal Medicine

## 2016-04-08 ENCOUNTER — Telehealth: Payer: Self-pay | Admitting: Internal Medicine

## 2016-04-08 NOTE — Telephone Encounter (Signed)
Lab appointment made.  Patient requests a Friday appointment because she works at a [employer].  Please schedule an appointment.

## 2016-04-08 NOTE — Telephone Encounter (Signed)
Pt called returning your call.   Call pt @ [phone]. Thank you!

## 2016-04-08 NOTE — Telephone Encounter (Signed)
Unread mychart message mailed to patient 

## 2016-04-08 NOTE — Telephone Encounter (Signed)
Patient is aware of her lab results and would like to know if she should go to Endo for her thyroid?

## 2016-04-08 NOTE — Telephone Encounter (Signed)
Pt called needing to get her lab results from 04/02/2016.   Pt was not satisfied with the next appt that comes up for 08/25. I did sch pt so she had a appt on the sch.   Call pt @ [phone]. Thank you!

## 2016-04-08 NOTE — Telephone Encounter (Signed)
Please call and notify pt that her tsh is suppressed.  I do not mind referring her to endocrinology, but I thing we need a little more information first.  Needs to come in for more specific lab testing.  Schedule pt for non fasting lab appt within the next few days.  Also, after pt notified, send message to [facility] for earlier appt.  Thanks.

## 2016-04-08 NOTE — Telephone Encounter (Signed)
Left message on machine for patient to return our call 

## 2016-04-09 ENCOUNTER — Other Ambulatory Visit (INDEPENDENT_AMBULATORY_CARE_PROVIDER_SITE_OTHER): Payer: PRIVATE HEALTH INSURANCE

## 2016-04-09 ENCOUNTER — Other Ambulatory Visit: Payer: Self-pay | Admitting: Internal Medicine

## 2016-04-09 ENCOUNTER — Encounter: Payer: PRIVATE HEALTH INSURANCE | Admitting: Internal Medicine

## 2016-04-09 DIAGNOSIS — R7989 Other specified abnormal findings of blood chemistry: Secondary | ICD-10-CM

## 2016-04-09 DIAGNOSIS — R946 Abnormal results of thyroid function studies: Secondary | ICD-10-CM

## 2016-04-09 LAB — T4, FREE: Free T4: 1.7 ng/dL (ref 0.8–1.8)

## 2016-04-09 LAB — T3, FREE: T3 FREE: 3.7 pg/mL (ref 2.3–4.2)

## 2016-04-09 LAB — TSH: TSH: 0.01 mIU/L — ABNORMAL LOW

## 2016-04-09 NOTE — Progress Notes (Signed)
Orders placed for f/u labs.  

## 2016-04-09 NOTE — Telephone Encounter (Signed)
Follow up appt made for 6/16 @4pm 

## 2016-04-12 ENCOUNTER — Encounter: Payer: Self-pay | Admitting: Internal Medicine

## 2016-04-30 ENCOUNTER — Ambulatory Visit (INDEPENDENT_AMBULATORY_CARE_PROVIDER_SITE_OTHER): Payer: PRIVATE HEALTH INSURANCE | Admitting: Internal Medicine

## 2016-04-30 ENCOUNTER — Encounter: Payer: Self-pay | Admitting: Internal Medicine

## 2016-04-30 VITALS — BP 106/70 | HR 56 | Temp 97.7°F | Resp 18 | Ht 63.5 in | Wt 126.4 lb

## 2016-04-30 DIAGNOSIS — Z860100 Personal history of colon polyps, unspecified: Secondary | ICD-10-CM

## 2016-04-30 DIAGNOSIS — D039 Melanoma in situ, unspecified: Secondary | ICD-10-CM

## 2016-04-30 DIAGNOSIS — R7989 Other specified abnormal findings of blood chemistry: Secondary | ICD-10-CM

## 2016-04-30 DIAGNOSIS — E7800 Pure hypercholesterolemia, unspecified: Secondary | ICD-10-CM

## 2016-04-30 DIAGNOSIS — Z0000 Encounter for general adult medical examination without abnormal findings: Secondary | ICD-10-CM

## 2016-04-30 DIAGNOSIS — R946 Abnormal results of thyroid function studies: Secondary | ICD-10-CM

## 2016-04-30 NOTE — Progress Notes (Signed)
Patient ID: Michelle Roach, female   DOB: 01/10/1961, 55 y.o.   MRN: [id number]   Subjective:    Patient ID: Michelle Roach, female    DOB: 02-02-1961, 55 y.o.   MRN: [id number]  HPI  Patient here for her physical exam.  She is exercising.  Has lost weight.  No chest pain.  No sob.  No acid reflux.  No abdominal pain or cramping.  Bowels stable.  No sob.  No headache.  No dizziness.  No numbness.  Has noticed left eyelid closing at times.  No other neurological issues.  No drooping.  Able to open and close her eye.  No vision loss.  Just has the sensation change.     Past Medical History  Diagnosis Date  . 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 History of colon polyps    Past Surgical History  Procedure Laterality Date  . Tubal ligation  1994  . Tonsillectomy  1966   Family History  Problem Relation Age of Onset  . Hypertension Mother   . Diabetes Mother   . Cancer Father     colon  . Diabetes Maternal Grandmother   . Breast cancer Neg Hx    Social History   Social History  . Marital Status: Married    Spouse Name: N/A  . Number of Children: N/A  . Years of Education: N/A   Social History Main Topics  . Smoking status: Never Smoker   . Smokeless tobacco: Never Used  . Alcohol Use: 0.0 oz/week    0 Standard drinks or equivalent per week     Comment: rarely  . Drug Use: No  . Sexual Activity: Not Asked   Other Topics Concern  . None   Social History Narrative    Outpatient Encounter Prescriptions as of 04/30/2016  Medication Sig  . aspirin 81 MG tablet Take 81 mg by mouth daily.  . Magnesium 250 MG TABS Take by mouth at bedtime.  . Multiple Vitamin (MULTIVITAMIN) tablet Take 1 tablet by mouth daily. Reported on 04/30/2016  . Omega-3 Fatty Acids (FISH OIL) 500 MG CAPS Take by mouth. Reported on 04/30/2016   No facility-administered encounter medications on file as of 04/30/2016.    Review of Systems  Constitutional: Negative for appetite change and unexpected  weight change.       Has adjusted diet.  Lost weight.    HENT: Negative for congestion and sinus pressure.   Eyes: Negative for pain and visual disturbance.  Respiratory: Negative for cough, chest tightness and shortness of breath.   Cardiovascular: Negative for chest pain, palpitations and leg swelling.  Gastrointestinal: Negative for nausea, vomiting, abdominal pain and diarrhea.  Genitourinary: Negative for dysuria and difficulty urinating.  Musculoskeletal: Negative for back pain and joint swelling.  Skin: Negative for color change and rash.  Neurological: Negative for dizziness and light-headedness.  Psychiatric/Behavioral: Negative for dysphoric mood and agitation.       Objective:    Physical Exam  Constitutional: She is oriented to person, place, and time. She appears well-developed and well-nourished. No distress.  HENT:  Nose: Nose normal.  Mouth/Throat: Oropharynx is clear and moist.  Eyes: Right eye exhibits no discharge. Left eye exhibits no discharge. No scleral icterus.  Neck: Neck supple. No thyromegaly present.  Cardiovascular: Normal rate and regular rhythm.   Pulmonary/Chest: Breath sounds normal. No accessory muscle usage. No tachypnea. No respiratory distress. She has no decreased breath sounds. She has no wheezes.  She has no rhonchi. Right breast exhibits no inverted nipple, no mass, no nipple discharge and no tenderness (no axillary adenopathy). Left breast exhibits no inverted nipple, no mass, no nipple discharge and no tenderness (no axilarry adenopathy).  Abdominal: Soft. Bowel sounds are normal. There is no tenderness.  Musculoskeletal: She exhibits no edema or tenderness.  Lymphadenopathy:    She has no cervical adenopathy.  Neurological: She is alert and oriented to person, place, and time.  CNs II-XII intact.  EOMI.  No weakness noted - eyelid.    Skin: Skin is warm. No rash noted. No erythema.  Psychiatric: She has a normal mood and affect. Her behavior  is normal.    BP 106/70 mmHg  Pulse 56  Temp(Src) 97.7 F (36.5 C) (Oral)  Resp 18  Ht 5' 3.5" (1.613 m)  Wt 126 lb 6 oz (57.323 kg)  BMI 22.03 kg/m2  SpO2 98% Wt Readings from Last 3 Encounters:  04/30/16 126 lb 6 oz (57.323 kg)  02/21/15 143 lb 4 oz (64.978 kg)  02/08/14 148 lb 8 oz (67.359 kg)     Lab Results  Component Value Date   WBC 5.2 04/02/2016   HGB 13.4 04/02/2016   HCT 40.1 04/02/2016   PLT 159.0 04/02/2016   GLUCOSE 82 04/02/2016   CHOL 169 04/02/2016   TRIG 82.0 04/02/2016   HDL 55.50 04/02/2016   LDLDIRECT 147.0 06/08/2013   LDLCALC 97 04/02/2016   ALT 34 04/02/2016   AST 29 04/02/2016   NA 142 04/02/2016   K 4.2 04/02/2016   CL 107 04/02/2016   CREATININE 0.75 04/02/2016   BUN 16 04/02/2016   CO2 28 04/02/2016   TSH 0.01* 04/09/2016   INR 1.0 08/10/2013       Assessment & Plan:   Problem List Items Addressed This Visit    Health care maintenance    Physical today 04/30/16.  Colonoscopy 05/2012.  Recommended f/u in 2016.  Needs f/u.  Mammogram - needs.   Last 02/2015.        History of colonic polyps    Colonoscopy 05/16/12.  Recommended f/u colonoscopy 05/2015.        Hypercholesterolemia    She has adjusted her diet.  Lost weight.  Cholesterol just checked and wnl.  Follow.       Low TSH level    Free T3 and free T4 wnl.  Recheck tsh, free T3 and free T4 in 4-6 weeks.        Melanoma in situ [facility])    Followed by dermatology.         Other Visit Diagnoses    Routine general medical examination at a health care facility    -  Primary        [name], MD

## 2016-04-30 NOTE — Progress Notes (Signed)
Pre-visit discussion using our clinic review tool. No additional management support is needed unless otherwise documented below in the visit note.  

## 2016-05-03 ENCOUNTER — Other Ambulatory Visit: Payer: Self-pay | Admitting: Internal Medicine

## 2016-05-03 DIAGNOSIS — R7989 Other specified abnormal findings of blood chemistry: Secondary | ICD-10-CM

## 2016-05-03 NOTE — Progress Notes (Signed)
Labs ordered.

## 2016-05-05 ENCOUNTER — Encounter: Payer: Self-pay | Admitting: Internal Medicine

## 2016-05-05 DIAGNOSIS — E039 Hypothyroidism, unspecified: Secondary | ICD-10-CM | POA: Insufficient documentation

## 2016-05-05 NOTE — Assessment & Plan Note (Signed)
She has adjusted her diet.  Lost weight.  Cholesterol just checked and wnl.  Follow.

## 2016-05-05 NOTE — Assessment & Plan Note (Addendum)
Colonoscopy 05/16/12.  Recommended f/u colonoscopy 05/2015.

## 2016-05-05 NOTE — Assessment & Plan Note (Signed)
Followed by dermatology

## 2016-05-05 NOTE — Assessment & Plan Note (Signed)
Physical today 04/30/16.  Colonoscopy 05/2012.  Recommended f/u in 2016.  Needs f/u.  Mammogram - needs.   Last 02/2015.

## 2016-05-05 NOTE — Assessment & Plan Note (Signed)
Free T3 and free T4 wnl.  Recheck tsh, free T3 and free T4 in 4-6 weeks.

## 2016-05-19 ENCOUNTER — Other Ambulatory Visit (INDEPENDENT_AMBULATORY_CARE_PROVIDER_SITE_OTHER): Payer: PRIVATE HEALTH INSURANCE

## 2016-05-19 DIAGNOSIS — R7989 Other specified abnormal findings of blood chemistry: Secondary | ICD-10-CM

## 2016-05-19 DIAGNOSIS — R946 Abnormal results of thyroid function studies: Secondary | ICD-10-CM

## 2016-05-20 LAB — T3, FREE: T3, Free: 2.6 pg/mL (ref 2.3–4.2)

## 2016-05-20 LAB — TSH: TSH: 11.16 u[IU]/mL — ABNORMAL HIGH (ref 0.35–4.50)

## 2016-05-20 LAB — T4, FREE: Free T4: 0.48 ng/dL — ABNORMAL LOW (ref 0.60–1.60)

## 2016-05-21 ENCOUNTER — Encounter: Payer: Self-pay | Admitting: Internal Medicine

## 2016-05-21 ENCOUNTER — Other Ambulatory Visit: Payer: Self-pay | Admitting: Internal Medicine

## 2016-05-21 MED ORDER — LEVOTHYROXINE SODIUM 50 MCG PO TABS: 50.0000 ug | ORAL_TABLET | Freq: Every day | ORAL | Status: DC

## 2016-05-21 NOTE — Progress Notes (Signed)
rx sent in for synthroid #30 with 2 refills.

## 2016-05-25 NOTE — Telephone Encounter (Signed)
Left message on voicemail asking pt to read & respond to mychart or contact office

## 2016-06-01 ENCOUNTER — Encounter: Payer: Self-pay | Admitting: Internal Medicine

## 2016-07-06 ENCOUNTER — Telehealth: Payer: Self-pay

## 2016-07-06 DIAGNOSIS — E039 Hypothyroidism, unspecified: Secondary | ICD-10-CM

## 2016-07-06 NOTE — Telephone Encounter (Signed)
Pt is on lab schedule for 07/07/16. Looks like maybe for repeat TSH. Please place future orders. Thank you.

## 2016-07-06 NOTE — Telephone Encounter (Signed)
Order placed for f/u tsh.  

## 2016-07-07 ENCOUNTER — Other Ambulatory Visit (INDEPENDENT_AMBULATORY_CARE_PROVIDER_SITE_OTHER): Payer: Self-pay

## 2016-07-07 DIAGNOSIS — E039 Hypothyroidism, unspecified: Secondary | ICD-10-CM

## 2016-07-08 LAB — TSH: TSH: 3.16 u[IU]/mL (ref 0.35–4.50)

## 2016-07-09 ENCOUNTER — Encounter: Payer: PRIVATE HEALTH INSURANCE | Admitting: Internal Medicine

## 2016-07-09 ENCOUNTER — Other Ambulatory Visit: Payer: Self-pay | Admitting: Internal Medicine

## 2016-07-09 DIAGNOSIS — E039 Hypothyroidism, unspecified: Secondary | ICD-10-CM

## 2016-07-22 ENCOUNTER — Other Ambulatory Visit: Payer: Self-pay | Admitting: Internal Medicine

## 2016-08-18 ENCOUNTER — Encounter: Payer: Self-pay | Admitting: Internal Medicine

## 2016-08-19 ENCOUNTER — Other Ambulatory Visit: Payer: Self-pay

## 2016-08-19 MED ORDER — LEVOTHYROXINE SODIUM 50 MCG PO TABS: 50.0000 ug | ORAL_TABLET | Freq: Every day | ORAL | 4 refills | Status: DC

## 2016-10-04 ENCOUNTER — Other Ambulatory Visit (INDEPENDENT_AMBULATORY_CARE_PROVIDER_SITE_OTHER): Payer: Self-pay

## 2016-10-04 DIAGNOSIS — E039 Hypothyroidism, unspecified: Secondary | ICD-10-CM

## 2016-10-05 LAB — TSH: TSH: 3.35 u[IU]/mL (ref 0.35–4.50)

## 2016-10-06 ENCOUNTER — Encounter: Payer: Self-pay | Admitting: Internal Medicine

## 2016-12-20 ENCOUNTER — Telehealth: Payer: Self-pay | Admitting: Internal Medicine

## 2016-12-20 DIAGNOSIS — E7800 Pure hypercholesterolemia, unspecified: Secondary | ICD-10-CM

## 2016-12-20 DIAGNOSIS — R7989 Other specified abnormal findings of blood chemistry: Secondary | ICD-10-CM

## 2016-12-20 NOTE — Telephone Encounter (Signed)
Pt would like to have labs done before her appt in June.. Please advise

## 2016-12-21 NOTE — Telephone Encounter (Signed)
Order placed for labs.  Ok to schedule lab appt prior to her appt.  Thanks

## 2016-12-21 NOTE — Telephone Encounter (Signed)
Lab scheduled °

## 2016-12-23 ENCOUNTER — Telehealth: Payer: Self-pay | Admitting: *Deleted

## 2016-12-23 NOTE — Telephone Encounter (Signed)
Pt requested a paper Rx for Levothyroxine - 3 month supply  Pt contact [phone]

## 2016-12-23 NOTE — Telephone Encounter (Signed)
Can we get this script

## 2016-12-24 MED ORDER — LEVOTHYROXINE SODIUM 50 MCG PO TABS: 50.0000 ug | ORAL_TABLET | Freq: Every day | ORAL | 1 refills | Status: DC

## 2016-12-24 NOTE — Telephone Encounter (Signed)
Pt informed put up at check in for pick up.

## 2016-12-24 NOTE — Telephone Encounter (Signed)
rx ok'd for levothyroxine #90 with 1 refill. rx printed.

## 2017-02-25 IMAGING — MG MM DIGITAL SCREENING BILAT W/ CAD
1 series · 4 of 4 positions shown · non-contrast
Comparison: Previous exam(s).

CLINICAL DATA: Screening.

EXAM:
DIGITAL SCREENING BILATERAL MAMMOGRAM WITH CAD

[R CC · right · 4 of 4 slices shown]
[im 1/4]
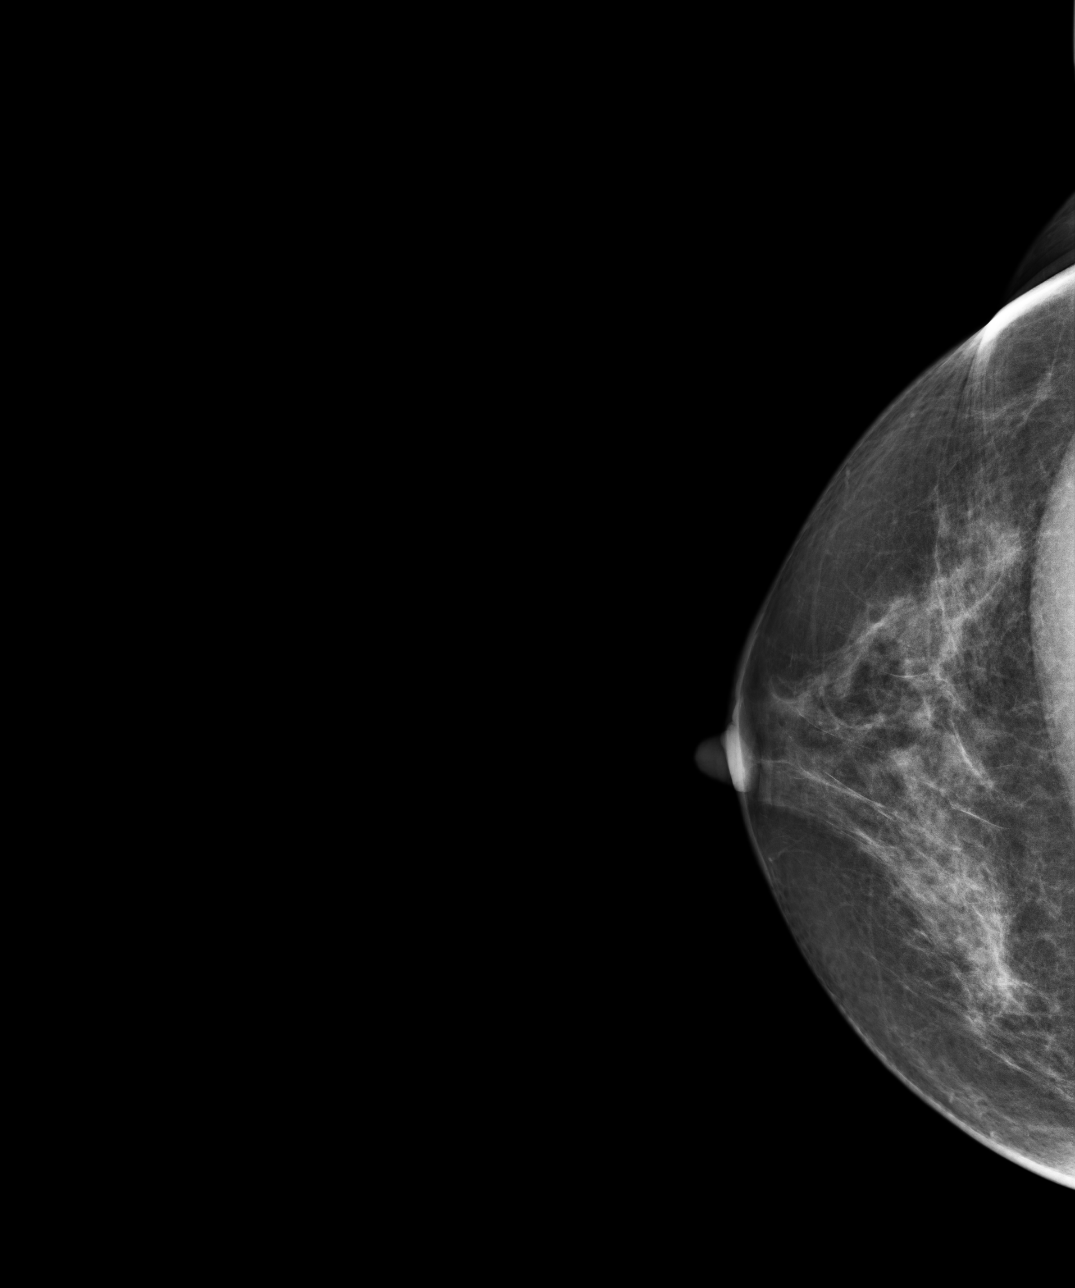
[im 2/4]
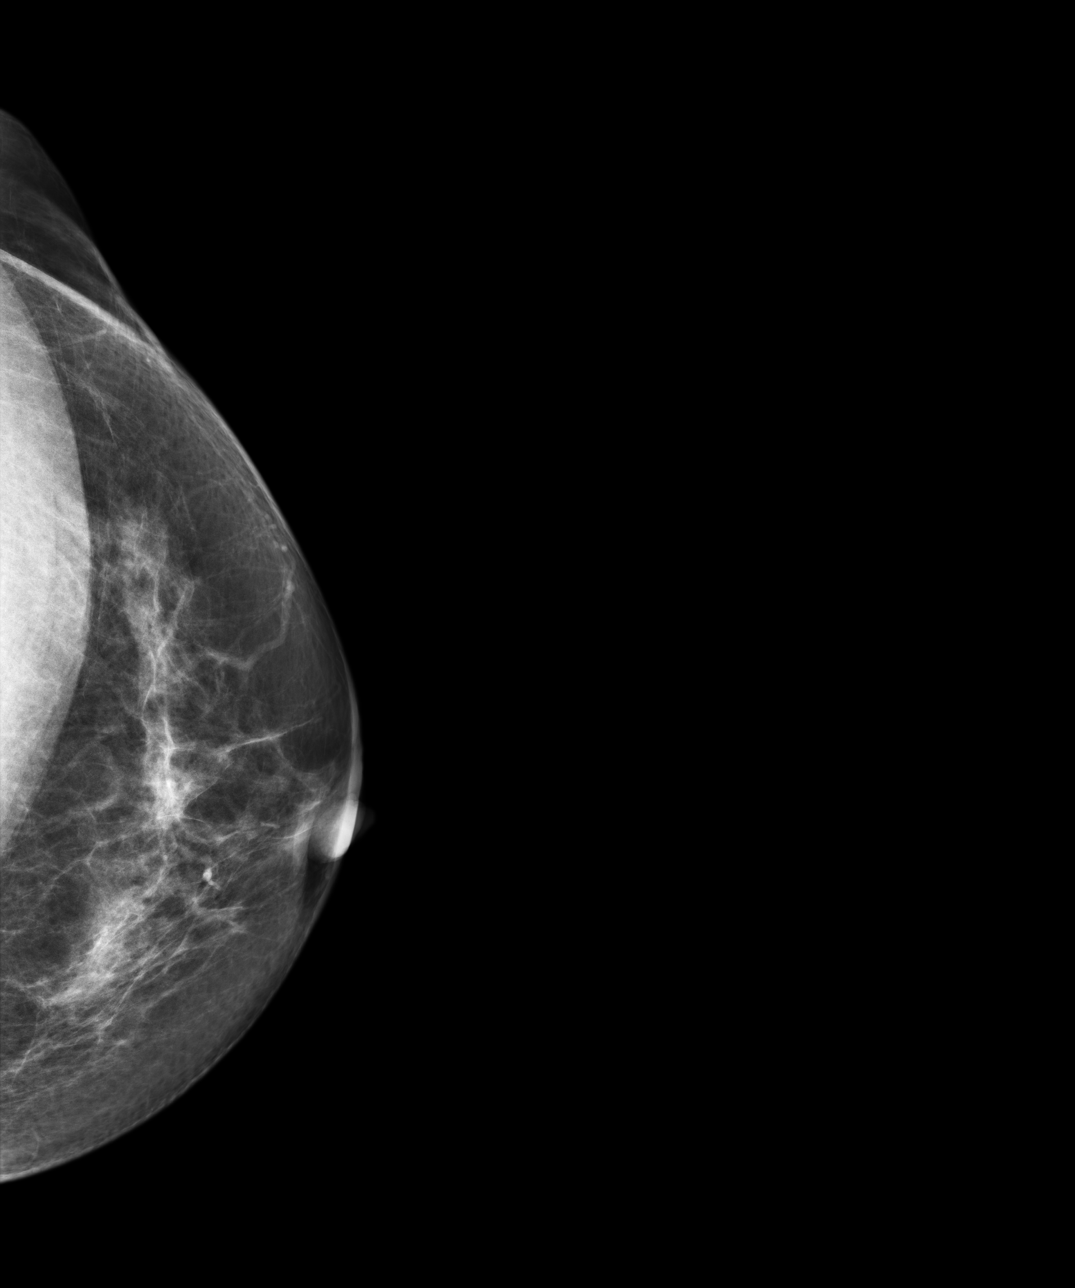
[im 3/4]
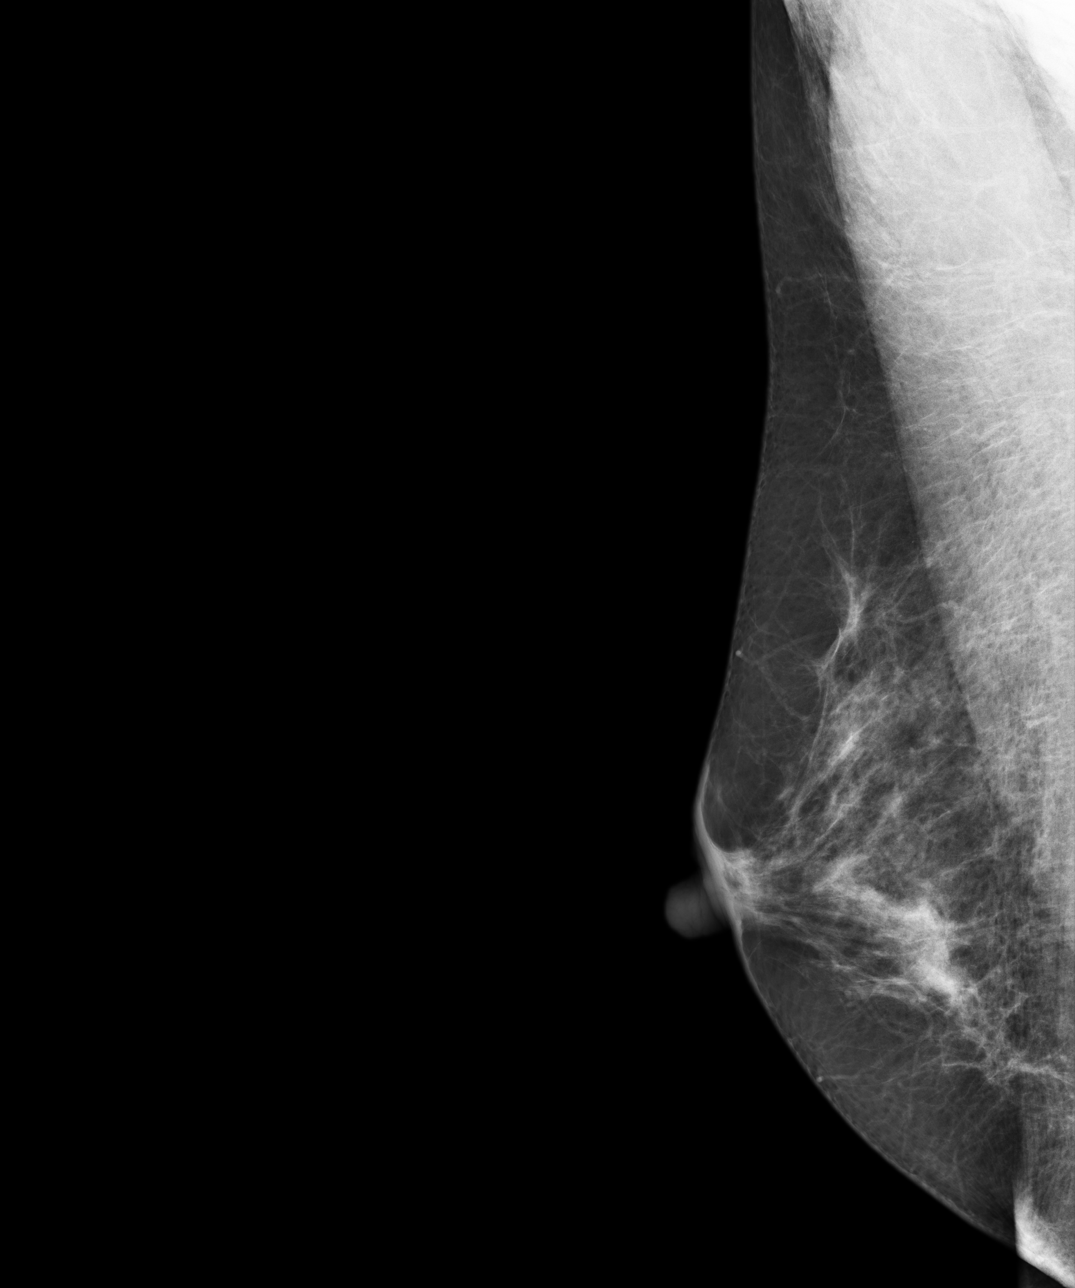
[im 4/4]
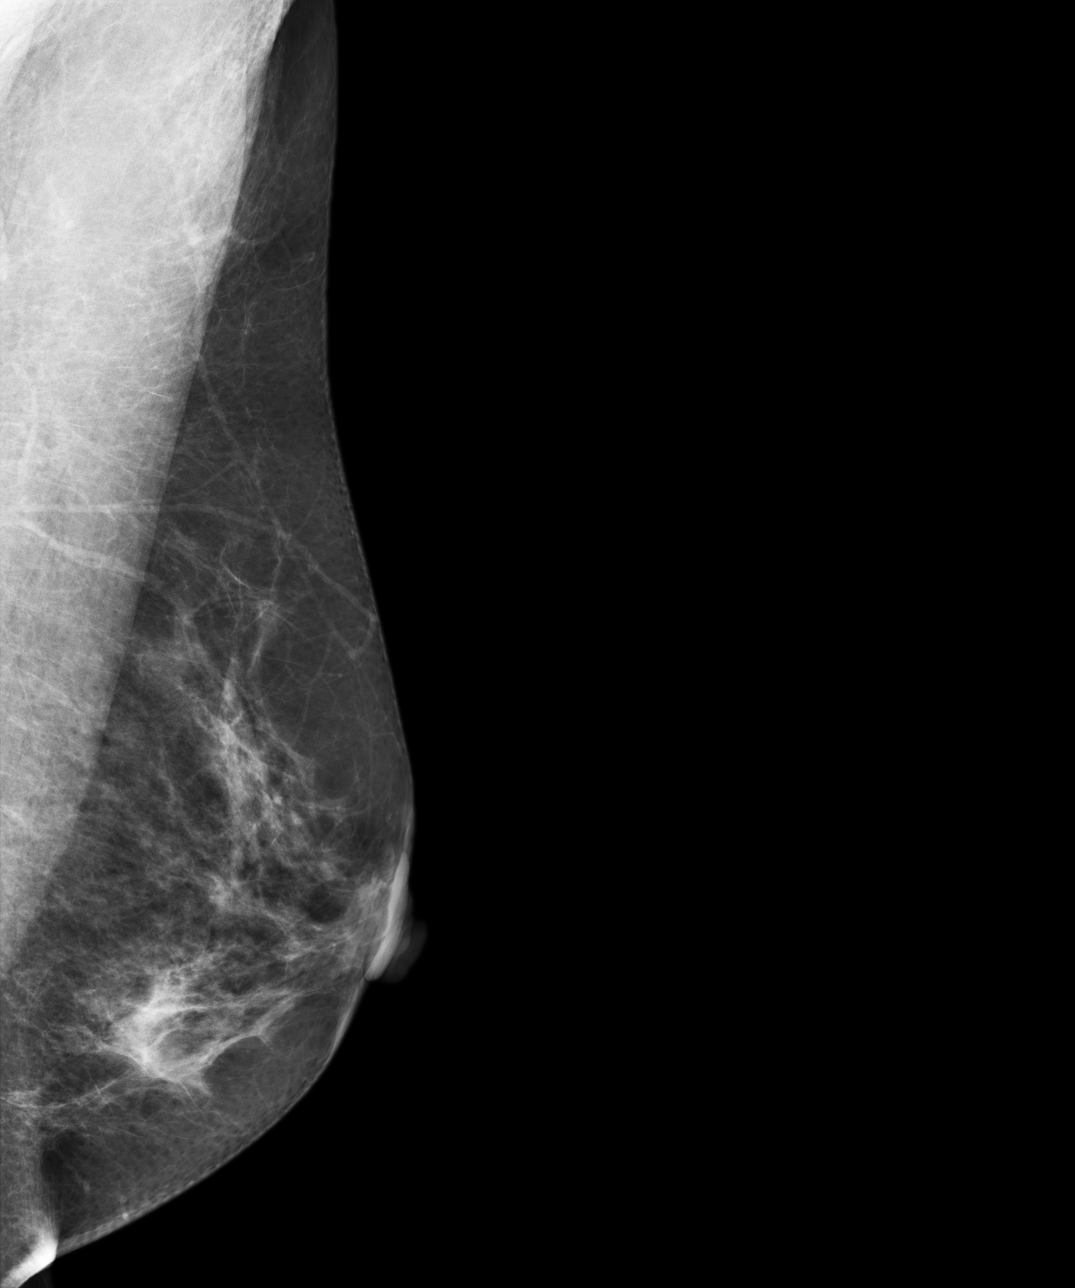

[4 of 4 positions shown; findings below may reference images not displayed]

ACR Breast Density Category c: The breast tissue is heterogeneously
dense, which may obscure small masses.
FINDINGS: There are no findings suspicious for malignancy. Images were
processed with CAD.
IMPRESSION: No mammographic evidence of malignancy. A result letter of this
screening mammogram will be mailed directly to the patient.

RECOMMENDATION:
Screening mammogram in one year. (Code:YJ-[id number])

BI-RADS CATEGORY  1: Negative.

## 2017-04-22 ENCOUNTER — Other Ambulatory Visit (INDEPENDENT_AMBULATORY_CARE_PROVIDER_SITE_OTHER): Payer: Self-pay

## 2017-04-22 DIAGNOSIS — E7800 Pure hypercholesterolemia, unspecified: Secondary | ICD-10-CM

## 2017-04-22 DIAGNOSIS — R946 Abnormal results of thyroid function studies: Secondary | ICD-10-CM

## 2017-04-22 DIAGNOSIS — R7989 Other specified abnormal findings of blood chemistry: Secondary | ICD-10-CM

## 2017-04-22 LAB — LIPID PANEL
Cholesterol: 196 mg/dL (ref 0–200)
HDL: 64.1 mg/dL (ref >39.00)
LDL Cholesterol: 118 mg/dL — ABNORMAL HIGH (ref 0–99)
NONHDL: 131.64
Total CHOL/HDL Ratio: 3
Triglycerides: 66 mg/dL (ref 0.0–149.0)
VLDL: 13.2 mg/dL (ref 0.0–40.0)

## 2017-04-22 LAB — COMPREHENSIVE METABOLIC PANEL
ALBUMIN: 4.1 g/dL (ref 3.5–5.2)
ALT: 14 U/L (ref 0–35)
AST: 19 U/L (ref 0–37)
Alkaline Phosphatase: 47 U/L (ref 39–117)
BUN: 18 mg/dL (ref 6–23)
CHLORIDE: 107 meq/L (ref 96–112)
CO2: 30 meq/L (ref 19–32)
Calcium: 9 mg/dL (ref 8.4–10.5)
Creatinine, Ser: 0.99 mg/dL (ref 0.40–1.20)
GFR: 61.25 mL/min (ref >60.00)
Glucose, Bld: 90 mg/dL (ref 70–99)
Potassium: 4.4 mEq/L (ref 3.5–5.1)
SODIUM: 140 meq/L (ref 135–145)
Total Bilirubin: 0.3 mg/dL (ref 0.2–1.2)
Total Protein: 6.7 g/dL (ref 6.0–8.3)

## 2017-04-22 LAB — CBC WITH DIFFERENTIAL/PLATELET
BASOS PCT: 0.5 % (ref 0.0–3.0)
Basophils Absolute: 0 10*3/uL (ref 0.0–0.1)
EOS PCT: 1.6 % (ref 0.0–5.0)
Eosinophils Absolute: 0.1 10*3/uL (ref 0.0–0.7)
HCT: 40.6 % (ref 36.0–46.0)
Hemoglobin: 13.4 g/dL (ref 12.0–15.0)
LYMPHS ABS: 1.5 10*3/uL (ref 0.7–4.0)
Lymphocytes Relative: 32.3 % (ref 12.0–46.0)
MCHC: 33.1 g/dL (ref 30.0–36.0)
MCV: 88.5 fl (ref 78.0–100.0)
MONO ABS: 0.3 10*3/uL (ref 0.1–1.0)
Monocytes Relative: 6.8 % (ref 3.0–12.0)
NEUTROS ABS: 2.7 10*3/uL (ref 1.4–7.7)
NEUTROS PCT: 58.8 % (ref 43.0–77.0)
PLATELETS: 173 10*3/uL (ref 150.0–400.0)
RBC: 4.59 Mil/uL (ref 3.87–5.11)
RDW: 14.2 % (ref 11.5–15.5)
WBC: 4.7 10*3/uL (ref 4.0–10.5)

## 2017-04-22 LAB — TSH: TSH: 2.23 u[IU]/mL (ref 0.35–4.50)

## 2017-04-23 ENCOUNTER — Encounter: Payer: Self-pay | Admitting: Internal Medicine

## 2017-04-29 ENCOUNTER — Encounter: Payer: Self-pay | Admitting: Internal Medicine

## 2017-04-29 ENCOUNTER — Ambulatory Visit (INDEPENDENT_AMBULATORY_CARE_PROVIDER_SITE_OTHER): Payer: Self-pay | Admitting: Internal Medicine

## 2017-04-29 ENCOUNTER — Telehealth: Payer: Self-pay

## 2017-04-29 VITALS — BP 126/78 | HR 60 | Temp 98.3°F | Ht 63.5 in | Wt 133.6 lb

## 2017-04-29 DIAGNOSIS — Z1231 Encounter for screening mammogram for malignant neoplasm of breast: Secondary | ICD-10-CM

## 2017-04-29 DIAGNOSIS — E039 Hypothyroidism, unspecified: Secondary | ICD-10-CM

## 2017-04-29 DIAGNOSIS — Z86010 Personal history of colonic polyps: Secondary | ICD-10-CM

## 2017-04-29 DIAGNOSIS — Z1239 Encounter for other screening for malignant neoplasm of breast: Secondary | ICD-10-CM

## 2017-04-29 DIAGNOSIS — R0981 Nasal congestion: Secondary | ICD-10-CM

## 2017-04-29 DIAGNOSIS — E7800 Pure hypercholesterolemia, unspecified: Secondary | ICD-10-CM

## 2017-04-29 DIAGNOSIS — Z0000 Encounter for general adult medical examination without abnormal findings: Secondary | ICD-10-CM

## 2017-04-29 MED ORDER — LEVOTHYROXINE SODIUM 50 MCG PO TABS: 50.0000 ug | ORAL_TABLET | Freq: Every day | ORAL | 3 refills | Status: DC

## 2017-04-29 NOTE — Progress Notes (Signed)
Pre visit review using our clinic review tool, if applicable. No additional management support is needed unless otherwise documented below in the visit note. 

## 2017-04-29 NOTE — Telephone Encounter (Signed)
Please call [facility] to get patients last Colonoscopy.  Office was closed today. Please advise.

## 2017-04-29 NOTE — Assessment & Plan Note (Addendum)
Physical today 04/29/17.  PAP 03/03/15 - negative with negative HPV.  Colonoscopy 2016 as outlined.  Schedule mammogram.  Overdue.

## 2017-04-29 NOTE — Progress Notes (Addendum)
Patient ID: Michelle Roach, female   DOB: July 11, 1961, 56 y.o.   MRN: [id number]   Subjective:    Patient ID: Michelle Roach, female    DOB: [birth year], 56 y.o.   MRN: [id number]  HPI  Patient here for her physical exam.  She reports she has been doing relatively well.  Does reports having some congestion.  Started with scratchy throat. Some nasal congestion and drainage. No chest congestion.  No sob.  No fever.  Feels some better.  No acid reflux.  No abdominal pain.  Bowels moving.  Had colonoscopy 2016.  Due 2019.   Discussed cholesterol results.     Past Medical History:  Diagnosis Date  . History of colon polyps   . Melanoma in situ [facility])    right leg   Past Surgical History:  Procedure Laterality Date  . TONSILLECTOMY  1966  . TUBAL LIGATION  1994   Family History  Problem Relation Age of Onset  . Hypertension Mother   . Diabetes Mother   . Cancer Father        colon  . Diabetes Maternal Grandmother   . Breast cancer Neg Hx    Social History   Social History  . Marital status: Married    Spouse name: N/A  . Number of children: N/A  . Years of education: N/A   Social History Main Topics  . Smoking status: Never Smoker  . Smokeless tobacco: Never Used  . Alcohol use 0.0 oz/week     Comment: rarely  . Drug use: No  . Sexual activity: Not Asked   Other Topics Concern  . None   Social History Narrative  . None    Outpatient Encounter Prescriptions as of 04/29/2017  Medication Sig  . aspirin 81 MG tablet Take 81 mg by mouth daily.  Marland Kitchen levothyroxine (SYNTHROID, LEVOTHROID) 50 MCG tablet Take 1 tablet (50 mcg total) by mouth daily before breakfast.  . Magnesium 250 MG TABS Take by mouth at bedtime.  . Multiple Vitamin (MULTIVITAMIN) tablet Take 1 tablet by mouth daily. Reported on 04/30/2016  . [DISCONTINUED] levothyroxine (SYNTHROID, LEVOTHROID) 50 MCG tablet Take 1 tablet (50 mcg total) by mouth daily before breakfast.  . [DISCONTINUED] Omega-3 Fatty Acids (FISH  OIL) 500 MG CAPS Take by mouth. Reported on 04/30/2016   No facility-administered encounter medications on file as of 04/29/2017.     Review of Systems  Constitutional: Negative for appetite change and unexpected weight change.  HENT: Positive for congestion and postnasal drip. Negative for sinus pressure.   Eyes: Negative for pain and visual disturbance.  Respiratory: Negative for cough, chest tightness and shortness of breath.   Cardiovascular: Negative for chest pain, palpitations and leg swelling.  Gastrointestinal: Negative for abdominal pain, diarrhea, nausea and vomiting.  Genitourinary: Negative for difficulty urinating and dysuria.  Musculoskeletal: Negative for back pain and joint swelling.  Skin: Negative for color change and rash.  Neurological: Negative for dizziness, light-headedness and headaches.  Hematological: Negative for adenopathy. Does not bruise/bleed easily.  Psychiatric/Behavioral: Negative for agitation and dysphoric mood.       Objective:    Physical Exam  Constitutional: She is oriented to person, place, and time. She appears well-developed and well-nourished. No distress.  HENT:  Nose: Nose normal.  Mouth/Throat: Oropharynx is clear and moist.  Eyes: Right eye exhibits no discharge. Left eye exhibits no discharge. No scleral icterus.  Neck: Neck supple. No thyromegaly present.  Cardiovascular: Normal rate and regular rhythm.   Pulmonary/Chest:  Breath sounds normal. No accessory muscle usage. No tachypnea. No respiratory distress. She has no decreased breath sounds. She has no wheezes. She has no rhonchi. Right breast exhibits no inverted nipple, no mass, no nipple discharge and no tenderness (no axillary adenopathy). Left breast exhibits no inverted nipple, no mass, no nipple discharge and no tenderness (no axilarry adenopathy).  Abdominal: Soft. Bowel sounds are normal. There is no tenderness.  Musculoskeletal: She exhibits no edema or tenderness.    Lymphadenopathy:    She has no cervical adenopathy.  Neurological: She is alert and oriented to person, place, and time.  Skin: Skin is warm. No rash noted. No erythema.  Psychiatric: She has a normal mood and affect. Her behavior is normal.    BP 126/78   Pulse 60   Temp 98.3 F (36.8 C) (Oral)   Ht 5' 3.5" (1.613 m)   Wt 133 lb 9.6 oz (60.6 kg)   SpO2 98%   BMI 23.29 kg/m  Wt Readings from Last 3 Encounters:  04/29/17 133 lb 9.6 oz (60.6 kg)  04/30/16 126 lb 6 oz (57.3 kg)  02/21/15 143 lb 4 oz (65 kg)     Lab Results  Component Value Date   WBC 4.7 04/22/2017   HGB 13.4 04/22/2017   HCT 40.6 04/22/2017   PLT 173.0 04/22/2017   GLUCOSE 90 04/22/2017   CHOL 196 04/22/2017   TRIG 66.0 04/22/2017   HDL 64.10 04/22/2017   LDLDIRECT 147.0 06/08/2013   LDLCALC 118 (H) 04/22/2017   ALT 14 04/22/2017   AST 19 04/22/2017   NA 140 04/22/2017   K 4.4 04/22/2017   CL 107 04/22/2017   CREATININE 0.99 04/22/2017   BUN 18 04/22/2017   CO2 30 04/22/2017   TSH 2.23 04/22/2017   INR 1.0 08/10/2013       Assessment & Plan:   Problem List Items Addressed This Visit    Health care maintenance    Physical today 04/29/17.  PAP 03/03/15 - negative with negative HPV.  Colonoscopy 2016 as outlined.  Schedule mammogram.  Overdue.        History of colonic polyps    Colonoscopy 09/2015 - as outlined.  Recommended f/u colonoscopy in 09/2018.        Hypercholesterolemia    Discussed cholesterol results.  Continue low cholesterol diet and exercise.  Follow lipid panel.        Hypothyroidism    On thyroid replacement.  Follow tsh.  Recent check wnl.       Relevant Medications   levothyroxine (SYNTHROID, LEVOTHROID) 50 MCG tablet    Other Visit Diagnoses    Breast cancer screening    -  Primary   Relevant Orders   MM DIGITAL SCREENING BILATERAL   Congestion of nasal sinus       Saline nasal spray and robitussin as directed.  doing better.  follow.        [name], MD

## 2017-05-01 ENCOUNTER — Encounter: Payer: Self-pay | Admitting: Internal Medicine

## 2017-05-01 NOTE — Assessment & Plan Note (Signed)
On thyroid replacement.  Follow tsh. Recent check wnl.  

## 2017-05-01 NOTE — Assessment & Plan Note (Signed)
Discussed cholesterol results.  Continue low cholesterol diet and exercise.  Follow lipid panel.

## 2017-05-01 NOTE — Assessment & Plan Note (Signed)
Colonoscopy 09/2015 - as outlined.  Recommended f/u colonoscopy in 09/2018.

## 2017-05-02 NOTE — Telephone Encounter (Signed)
Called GI still closed will call back later and make sure its not just for lunch

## 2017-05-02 NOTE — Telephone Encounter (Signed)
Received put in your results folder for review.

## 2017-05-02 NOTE — Telephone Encounter (Signed)
Called office last colonoscopy was done on 10/14/2015. They will fax report now.

## 2017-05-03 NOTE — Telephone Encounter (Signed)
Will review when return to work.  Out on vacation.

## 2017-05-06 ENCOUNTER — Encounter: Payer: PRIVATE HEALTH INSURANCE | Admitting: Internal Medicine

## 2017-06-10 ENCOUNTER — Ambulatory Visit
Admission: RE | Admit: 2017-06-10 | Discharge: 2017-06-10 | Disposition: A | Discharge status: Home / Self Care | Payer: Self-pay | Source: Ambulatory Visit | Attending: Internal Medicine | Admitting: Internal Medicine

## 2017-06-10 ENCOUNTER — Encounter: Payer: Self-pay | Admitting: Radiology

## 2017-06-10 DIAGNOSIS — Z1239 Encounter for other screening for malignant neoplasm of breast: Secondary | ICD-10-CM

## 2017-06-20 ENCOUNTER — Encounter: Payer: Self-pay | Admitting: Internal Medicine

## 2017-06-20 ENCOUNTER — Other Ambulatory Visit: Payer: Self-pay | Admitting: Internal Medicine

## 2017-06-21 MED ORDER — LEVOTHYROXINE SODIUM 50 MCG PO TABS: ORAL_TABLET | ORAL | 1 refills | Status: DC

## 2017-08-05 ENCOUNTER — Telehealth: Payer: Self-pay | Admitting: *Deleted

## 2017-08-05 NOTE — Telephone Encounter (Signed)
If blood pressure up and feeling shaky, etc, (history of recent tick bite, etc), I do recommend evaluation today to confirm nothing more acute going on.  Then we can f/u after.  Urgent care for evaluation today.

## 2017-08-05 NOTE — Telephone Encounter (Signed)
Patient stated she did not want to go to ED or urgent care. She was upset that she called "her doctor and could not be seen or worked in." Patient was wondering if there was any way we could work her in next week. I let patient know that your schedule was completely booked and patient stated that she is very disappointed that we do not leave room in your schedule for emergencies.

## 2017-08-05 NOTE — Telephone Encounter (Signed)
Advised patient again to go to urgent care or ED to be evaluated today. Patient aware of appointment date/time. Please schedule for Wednesday at 12:30 with Dr. Nicki Reaper.

## 2017-08-05 NOTE — Telephone Encounter (Signed)
Patient has not felt good the last 3 days. Her blood pressure has been running above 140/90 (about 148/98.) Patient denies fever, chills, nausea, vomit. Patient has been feeling shaky. and Patient was bitten by a deer tick about 2 weeks ago. Patient also says that she has been having chest discomfort intermittently on left side. I advised patient to go to ED or urgent care and patient declined. Please advise

## 2017-08-05 NOTE — Telephone Encounter (Signed)
Patient scheduled.

## 2017-08-05 NOTE — Telephone Encounter (Signed)
Pt requested a appt with Dr. Nicki Reaper to discuss blood pressure issues. _Pt currently has an blood pressure of 148/98 . Pt feels shaky with a high pulse.   Pt transferred over to team health  Pt contact [phone]

## 2017-08-05 NOTE — Telephone Encounter (Signed)
I still think she needs to be evaluated today to confirm nothing acute going on and then I can f/u with her next week.  I will work her in Wednesday 08/10/17 at 12:30.

## 2017-08-10 ENCOUNTER — Encounter: Payer: Self-pay | Admitting: Internal Medicine

## 2017-08-10 ENCOUNTER — Ambulatory Visit (INDEPENDENT_AMBULATORY_CARE_PROVIDER_SITE_OTHER): Payer: Self-pay | Admitting: Internal Medicine

## 2017-08-10 DIAGNOSIS — E039 Hypothyroidism, unspecified: Secondary | ICD-10-CM

## 2017-08-10 DIAGNOSIS — R079 Chest pain, unspecified: Secondary | ICD-10-CM

## 2017-08-10 DIAGNOSIS — D039 Melanoma in situ, unspecified: Secondary | ICD-10-CM

## 2017-08-10 DIAGNOSIS — E7800 Pure hypercholesterolemia, unspecified: Secondary | ICD-10-CM

## 2017-08-10 DIAGNOSIS — R030 Elevated blood-pressure reading, without diagnosis of hypertension: Secondary | ICD-10-CM

## 2017-08-10 NOTE — Progress Notes (Signed)
08/06/17 bp readings  8am 122/80 330pm 160/92 6pm 140/0 08/07/17 8am 120/82 1pm 150/92 230pm 150/90  Home readings  Went to [facility] clinic on the 21st and had labs drawn, available in [place].

## 2017-08-10 NOTE — Progress Notes (Signed)
Patient ID: [place] Sarinana, female   DOB: [birth year], 56 y.o.   MRN: [id number]   Subjective:    Patient ID: Hailly Fess, female    DOB: 1960-12-24, 56 y.o.   MRN: [id number]  HPI  Patient here as a work in with concerns regarding previous chest pain and elevated blood pressure.  She was initially seen on 08/05/17 at acute care.  Described discomfort in her left chest.  Note reviewed.  Concerned regarding elevated blood pressure. EKG SR with incomplete RBBB - per note.  Was placed on hctz.  Continued to have discomfort in her chest.  Went to ER 08/07/17.   Per note, felt symptoms not c/w ACS.  Negative troponin. CXR ok. Recommended f/u with cardiology for possible noninvasive evaluation and question of holter.  She reports she is feeling better. No pain now.  No sob.  No acid reflux.  No abdominal pain.  Bowels moving.  No aching.  No fever.  No headache.  Previous tick bite.  Overall feels better.  Blood pressures better now.     Past Medical History:  Diagnosis Date  . History of colon polyps   . Melanoma in situ [facility])    right leg   Past Surgical History:  Procedure Laterality Date  . TONSILLECTOMY  1966  . TUBAL LIGATION  1994   Family History  Problem Relation Age of Onset  . Hypertension Mother   . Diabetes Mother   . Cancer Father        colon  . Diabetes Maternal Grandmother   . Breast cancer Neg Hx    Social History   Social History  . Marital status: Married    Spouse name: N/A  . Number of children: N/A  . Years of education: N/A   Social History Main Topics  . Smoking status: Never Smoker  . Smokeless tobacco: Never Used  . Alcohol use 0.0 oz/week     Comment: rarely  . Drug use: No  . Sexual activity: Not Asked   Other Topics Concern  . None   Social History Narrative  . None    Outpatient Encounter Prescriptions as of 08/10/2017  Medication Sig  . aspirin 81 MG tablet Take 81 mg by mouth daily.  Marland Kitchen levothyroxine (SYNTHROID, LEVOTHROID) 50 MCG tablet  TAKE 1 TABLET BY MOUTH ONCE DAILY BEFORE BREAKFAST  . Magnesium 250 MG TABS Take by mouth at bedtime.  . Multiple Vitamin (MULTIVITAMIN) tablet Take 1 tablet by mouth daily. Reported on 04/30/2016  . hydrochlorothiazide (HYDRODIURIL) 25 MG tablet Take 25 mg by mouth daily.   No facility-administered encounter medications on file as of 08/10/2017.     Review of Systems  Constitutional: Negative for appetite change and unexpected weight change.  HENT: Negative for congestion and sinus pressure.   Respiratory: Negative for cough, chest tightness and shortness of breath.   Cardiovascular: Negative for palpitations and leg swelling.       No chest pain now.   Gastrointestinal: Negative for abdominal pain, diarrhea, nausea and vomiting.  Genitourinary: Negative for difficulty urinating and dysuria.  Musculoskeletal: Negative for back pain and joint swelling.  Skin: Negative for color change and rash.  Neurological: Negative for dizziness, light-headedness and headaches.  Psychiatric/Behavioral: Negative for agitation and dysphoric mood.       Objective:    Physical Exam  Constitutional: She appears well-developed and well-nourished. No distress.  HENT:  Nose: Nose normal.  Mouth/Throat: Oropharynx is clear and moist.  Neck: Neck supple. No  thyromegaly present.  Cardiovascular: Normal rate and regular rhythm.   Pulmonary/Chest: Breath sounds normal. No respiratory distress. She has no wheezes.  Abdominal: Soft. Bowel sounds are normal. There is no tenderness.  Musculoskeletal: She exhibits no edema or tenderness.  Lymphadenopathy:    She has no cervical adenopathy.  Skin: No rash noted. No erythema.  Psychiatric: She has a normal mood and affect. Her behavior is normal.    BP 124/80 (BP Location: Left Arm, Patient Position: Sitting, Cuff Size: Normal)   Pulse (!) 59   Temp 97.7 F (36.5 C) (Oral)   Resp 20   Wt 138 lb 2 oz (62.7 kg)   LMP 02/02/2009   SpO2 98%   BMI 24.08  kg/m  Wt Readings from Last 3 Encounters:  08/10/17 138 lb 2 oz (62.7 kg)  04/29/17 133 lb 9.6 oz (60.6 kg)  04/30/16 126 lb 6 oz (57.3 kg)     Lab Results  Component Value Date   WBC 4.7 04/22/2017   HGB 13.4 04/22/2017   HCT 40.6 04/22/2017   PLT 173.0 04/22/2017   GLUCOSE 90 04/22/2017   CHOL 196 04/22/2017   TRIG 66.0 04/22/2017   HDL 64.10 04/22/2017   LDLDIRECT 147.0 06/08/2013   LDLCALC 118 (H) 04/22/2017   ALT 14 04/22/2017   AST 19 04/22/2017   NA 140 04/22/2017   K 4.4 04/22/2017   CL 107 04/22/2017   CREATININE 0.99 04/22/2017   BUN 18 04/22/2017   CO2 30 04/22/2017   TSH 2.23 04/22/2017   INR 1.0 08/10/2013    Mm Digital Screening Bilateral  Result Date: 06/10/2017 CLINICAL DATA:  Screening. EXAM: DIGITAL SCREENING BILATERAL MAMMOGRAM WITH CAD COMPARISON:  Previous exam(s). ACR Breast Density Category c: The breast tissue is heterogeneously dense, which may obscure small masses. FINDINGS: There are no findings suspicious for malignancy. Images were processed with CAD. IMPRESSION: No mammographic evidence of malignancy. A result letter of this screening mammogram will be mailed directly to the patient. RECOMMENDATION: Screening mammogram in one year. (Code:[id number]) BI-RADS CATEGORY  1: Negative. Electronically Signed   By: Lillia Mountain M.D.   On: 06/10/2017 12:14       Assessment & Plan:   Problem List Items Addressed This Visit    Chest pain    Evaluated at acute care and ER as outlined.  EKG - RBBB.  No old to compare.  Troponin negative.  No pain now.  Had recommended cardiology evaluation.  See note.  [facility] has called to schedule.  She will f/u with cardiology.  No pain now.        Elevated blood pressure reading    Blood pressure previously elevated.  Improved now.  Check today ok.  Remain off hctz.  She only took for two days.  Follow pressures.  Follow metabolic panel.        Hypercholesterolemia    Low cholesterol diet and exercise.  Follow lipid  panel.       Relevant Medications   hydrochlorothiazide (HYDRODIURIL) 25 MG tablet   Hypothyroidism    On thyroid replacement.  Follow tsh.        Melanoma in situ [facility])    Followed by dermatology.            [name], MD

## 2017-08-13 ENCOUNTER — Encounter: Payer: Self-pay | Admitting: Internal Medicine

## 2017-08-13 DIAGNOSIS — R030 Elevated blood-pressure reading, without diagnosis of hypertension: Secondary | ICD-10-CM | POA: Insufficient documentation

## 2017-08-13 DIAGNOSIS — R079 Chest pain, unspecified: Secondary | ICD-10-CM | POA: Insufficient documentation

## 2017-08-13 NOTE — Assessment & Plan Note (Signed)
Blood pressure previously elevated.  Improved now.  Check today ok.  Remain off hctz.  She only took for two days.  Follow pressures.  Follow metabolic panel.

## 2017-08-13 NOTE — Assessment & Plan Note (Signed)
Followed by dermatology

## 2017-08-13 NOTE — Assessment & Plan Note (Signed)
On thyroid replacement.  Follow tsh.  

## 2017-08-13 NOTE — Assessment & Plan Note (Signed)
Evaluated at acute care and ER as outlined.  EKG - RBBB.  No old to compare.  Troponin negative.  No pain now.  Had recommended cardiology evaluation.  See note.  [facility] has called to schedule.  She will f/u with cardiology.  No pain now.

## 2017-08-13 NOTE — Assessment & Plan Note (Signed)
Low cholesterol diet and exercise.  Follow lipid panel.   

## 2017-08-17 ENCOUNTER — Encounter: Payer: Self-pay | Admitting: Internal Medicine

## 2017-09-16 ENCOUNTER — Encounter: Payer: Self-pay | Admitting: Internal Medicine

## 2017-09-19 MED ORDER — LEVOTHYROXINE SODIUM 50 MCG PO TABS: ORAL_TABLET | ORAL | 1 refills | Status: DC

## 2017-11-18 ENCOUNTER — Ambulatory Visit: Payer: Self-pay | Admitting: Internal Medicine

## 2017-12-19 ENCOUNTER — Telehealth: Payer: Self-pay | Admitting: Internal Medicine

## 2017-12-19 NOTE — Telephone Encounter (Signed)
Ok to fill 

## 2017-12-19 NOTE — Telephone Encounter (Signed)
Patient called and said it is ok [facility] with the manufacturer.  [facility] [employer], [facility] and told Dr. Nicki Reaper is ok with changing the manufacturer of levothyroxine and that patient will have lab work drawn in 6-8 weeks, she verbalized understanding and will fill the medication.

## 2017-12-19 NOTE — Telephone Encounter (Signed)
Copied from [place] [id number]. Topic: Quick Communication - See Telephone Encounter >> Dec 19, 2017  2:59 PM Corie Chiquito, [place] wrote: CRM for notification. Tammy from [facility] calling because she she would like to know if it would be ok if they use the manufacturer that they have in stock for the patients Levothyroxine. If someone could give her a call back at [phone] 12/19/17.

## 2017-12-19 NOTE — Telephone Encounter (Signed)
Please call pt and let her know that pharmacy is needing to change her thyroid medication.  Does she want to change or is she willing to take the name brand and then will not have to worry about future changes.  If wants name brand, just let pharmacy know.  If wants to change to what they are suggesting, then let pharmacy know and I would like to recheck her tsh in 6-8 weeks.

## 2018-03-14 ENCOUNTER — Encounter: Payer: Self-pay | Admitting: Internal Medicine

## 2018-03-15 ENCOUNTER — Other Ambulatory Visit: Payer: Self-pay

## 2018-03-15 MED ORDER — LEVOTHYROXINE SODIUM 50 MCG PO TABS: ORAL_TABLET | ORAL | 1 refills | Status: DC

## 2018-05-05 ENCOUNTER — Encounter: Payer: Self-pay | Admitting: Internal Medicine

## 2018-05-12 ENCOUNTER — Other Ambulatory Visit (HOSPITAL_COMMUNITY)
Admission: RE | Admit: 2018-05-12 | Discharge: 2018-05-12 | Disposition: A | Discharge status: Home / Self Care | Payer: Self-pay | Source: Ambulatory Visit | Attending: Internal Medicine | Admitting: Internal Medicine

## 2018-05-12 ENCOUNTER — Ambulatory Visit (INDEPENDENT_AMBULATORY_CARE_PROVIDER_SITE_OTHER): Payer: Self-pay | Admitting: Internal Medicine

## 2018-05-12 ENCOUNTER — Encounter: Payer: Self-pay | Admitting: Internal Medicine

## 2018-05-12 VITALS — BP 116/76 | HR 67 | Temp 98.1°F | Resp 18 | Ht 64.0 in | Wt 128.0 lb

## 2018-05-12 DIAGNOSIS — Z1231 Encounter for screening mammogram for malignant neoplasm of breast: Secondary | ICD-10-CM

## 2018-05-12 DIAGNOSIS — Z1239 Encounter for other screening for malignant neoplasm of breast: Secondary | ICD-10-CM

## 2018-05-12 DIAGNOSIS — D039 Melanoma in situ, unspecified: Secondary | ICD-10-CM

## 2018-05-12 DIAGNOSIS — Z860100 Personal history of colon polyps, unspecified: Secondary | ICD-10-CM

## 2018-05-12 DIAGNOSIS — J069 Acute upper respiratory infection, unspecified: Secondary | ICD-10-CM

## 2018-05-12 DIAGNOSIS — E7800 Pure hypercholesterolemia, unspecified: Secondary | ICD-10-CM

## 2018-05-12 DIAGNOSIS — E039 Hypothyroidism, unspecified: Secondary | ICD-10-CM

## 2018-05-12 DIAGNOSIS — Z124 Encounter for screening for malignant neoplasm of cervix: Secondary | ICD-10-CM

## 2018-05-12 DIAGNOSIS — Z0000 Encounter for general adult medical examination without abnormal findings: Secondary | ICD-10-CM

## 2018-05-12 MED ORDER — AMOXICILLIN 875 MG PO TABS: 875.0000 mg | ORAL_TABLET | Freq: Two times a day (BID) | ORAL | 0 refills | Status: DC

## 2018-05-12 NOTE — Patient Instructions (Signed)
Saline nasal spray -flush nose at least 2-3x/day  nasacort nasal spray - 2 sprays each nostril one time per day.  Do this in the evening.    Robitussin twice a day as needed for congestion.    If you take an antibiotic, then take a probiotic daily while you are on the antibiotic and for two weeks after completing the antibiotic.    Examples of probiotics:  Florastor, culturelle, align

## 2018-05-12 NOTE — Progress Notes (Signed)
Patient ID: Michelle Roach, female   DOB: 08/27/1961, 57 y.o.   MRN: [id number]   Subjective:    Patient ID: Michelle Roach, female    DOB: 1961-05-31, 57 y.o.   MRN: [id number]  HPI  Patient here for her physical exam.  She reports she has ben having some issues with nasal congestion and previous sore throat.  Has moved into her chest.  States symptoms started last week.  No sinus pressure.  Some nasal congestion - green in color at times.  Low grade fever previously.  No chest tightness or wheezing.  States not having much cough.  Taking alka seltzer.  No nausea or vomiting.  No diarrhea.  Eating.  Has lost weight.  States she has been doing an intermittent fast.  Fast for 16 hours per day and eats 8 hours per day.  Feels satisfied.  No abdominal pain.  Bowels moving.  Overall she feels she is doing relatively well.     Past Medical History:  Diagnosis Date  . History of colon polyps   . 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 TONSILLECTOMY  1966  . TUBAL LIGATION  1994   Family History  Problem Relation Age of Onset  . Hypertension Mother   . Diabetes Mother   . Cancer Father        colon  . Diabetes Maternal Grandmother   . Breast cancer Neg Hx    Social History   Socioeconomic History  . Marital status: Married    Spouse name: Not on file  . Number of children: Not on file  . Years of education: Not on file  . Highest education level: Not on file  Occupational History  . Not on file  Social Needs  . Financial resource strain: Not on file  . Food insecurity:    Worry: Not on file    Inability: Not on file  . Transportation needs:    Medical: Not on file    Non-medical: Not on file  Tobacco Use  . Smoking status: Never Smoker  . Smokeless tobacco: Never Used  Substance and Sexual Activity  . Alcohol use: Yes    Alcohol/week: 0.0 oz    Comment: rarely  . Drug use: No  . Sexual activity: Not on file  Lifestyle    . Physical activity:    Days per week: Not on file    Minutes per session: Not on file  . Stress: Not on file  Relationships  . Social connections:    Talks on phone: Not on file    Gets together: Not on file    Attends religious service: Not on file    Active member of club or organization: Not on file    Attends meetings of clubs or organizations: Not on file    Relationship status: Not on file  Other Topics Concern  . Not on file  Social History Narrative  . Not on file    Outpatient Encounter Medications as of 05/12/2018  Medication Sig  . amoxicillin (AMOXIL) 875 MG tablet Take 1 tablet (875 mg total) by mouth 2 (two) times daily.  Marland Kitchen aspirin 81 MG tablet Take 81 mg by mouth daily.  Marland Kitchen levothyroxine (SYNTHROID, LEVOTHROID) 50 MCG tablet TAKE 1 TABLET BY MOUTH ONCE DAILY BEFORE BREAKFAST  . Magnesium 250 MG TABS Take by mouth at bedtime.  . Multiple Vitamin (MULTIVITAMIN) tablet Take 1 tablet by  mouth daily. Reported on 04/30/2016  . [DISCONTINUED] hydrochlorothiazide (HYDRODIURIL) 25 MG tablet Take 25 mg by mouth daily.   No facility-administered encounter medications on file as of 05/12/2018.     Review of Systems  Constitutional: Negative for appetite change.       Has lost weight.  Trying to lose.    HENT: Positive for congestion. Negative for sinus pressure.        Previous sore throat.  No sore throat now.    Eyes: Negative for pain and visual disturbance.  Respiratory: Negative for chest tightness and shortness of breath.        Minimal cough.  Some chest congestion.   Cardiovascular: Negative for chest pain, palpitations and leg swelling.  Gastrointestinal: Negative for abdominal pain, diarrhea, nausea and vomiting.  Genitourinary: Negative for difficulty urinating and dysuria.  Musculoskeletal: Negative for joint swelling and myalgias.  Skin: Negative for color change and rash.  Neurological: Negative for dizziness, light-headedness and headaches.  Hematological:  Negative for adenopathy. Does not bruise/bleed easily.  Psychiatric/Behavioral: Negative for agitation and dysphoric mood.       Objective:    Physical Exam  Constitutional: She is oriented to person, place, and time. She appears well-developed and well-nourished. No distress.  HENT:  Mouth/Throat: Oropharynx is clear and moist.  Nares - slightly erythematous turbinates.   Eyes: Right eye exhibits no discharge. Left eye exhibits no discharge. No scleral icterus.  Neck: Neck supple. No thyromegaly present.  Cardiovascular: Normal rate and regular rhythm.  Pulmonary/Chest: Breath sounds normal. No accessory muscle usage. No tachypnea. No respiratory distress. She has no decreased breath sounds. She has no wheezes. She has no rhonchi. Right breast exhibits no inverted nipple, no mass, no nipple discharge and no tenderness (no axillary adenopathy). Left breast exhibits no inverted nipple, no mass, no nipple discharge and no tenderness (no axilarry adenopathy).  Abdominal: Soft. Bowel sounds are normal. There is no tenderness.  Genitourinary:  Genitourinary Comments: Normal external genitalia.  Vaginal vault without lesions.  Cervix identified.  Pap smear performed.  Could not appreciate any adnexal masses or tenderness.    Musculoskeletal: She exhibits no edema or tenderness.  Lymphadenopathy:    She has no cervical adenopathy.  Neurological: She is alert and oriented to person, place, and time.  Skin: No rash noted. No erythema.  Psychiatric: She has a normal mood and affect. Her behavior is normal.    BP 116/76 (BP Location: Left Arm, Patient Position: Sitting, Cuff Size: Normal)   Pulse 67   Temp 98.1 F (36.7 C) (Oral)   Resp 18   Ht 5\' 4"  (1.626 m)   Wt 128 lb (58.1 kg)   LMP 02/02/2009   SpO2 98%   BMI 21.97 kg/m  Wt Readings from Last 3 Encounters:  05/12/18 128 lb (58.1 kg)  08/10/17 138 lb 2 oz (62.7 kg)  04/29/17 133 lb 9.6 oz (60.6 kg)     Lab Results  Component  Value Date   WBC 8.4 05/12/2018   HGB 14.0 05/12/2018   HCT 42.2 05/12/2018   PLT 253 05/12/2018   GLUCOSE 52 (L) 05/12/2018   CHOL 230 (H) 05/12/2018   TRIG 141 05/12/2018   HDL 60 05/12/2018   LDLDIRECT 147.0 06/08/2013   LDLCALC 143 (H) 05/12/2018   ALT 32 (H) 05/12/2018   AST 25 05/12/2018   NA 141 05/12/2018   K 4.0 05/12/2018   CL 101 05/12/2018   CREATININE 0.88 05/12/2018   BUN 11  05/12/2018   CO2 26 05/12/2018   TSH 2.34 05/12/2018   INR 1.0 08/10/2013    Mm Digital Screening Bilateral  Result Date: 06/10/2017 CLINICAL DATA:  Screening. EXAM: DIGITAL SCREENING BILATERAL MAMMOGRAM WITH CAD COMPARISON:  Previous exam(s). ACR Breast Density Category c: The breast tissue is heterogeneously dense, which may obscure small masses. FINDINGS: There are no findings suspicious for malignancy. Images were processed with CAD. IMPRESSION: No mammographic evidence of malignancy. A result letter of this screening mammogram will be mailed directly to the patient. RECOMMENDATION: Screening mammogram in one year. (Code:SM-B-01Y) BI-RADS CATEGORY  1: Negative. Electronically Signed   By: Lillia Mountain M.D.   On: 06/10/2017 12:14       Assessment & Plan:   Problem List Items Addressed This Visit    Health care maintenance    Physical today 05/12/18.  PAP 05/12/18.  Colonoscopy 2016.  Mammogram 06/10/17 - Birads I.  Information given for [place] to schedule mammogram.        History of colonic polyps    Colonoscopy 09/2015 as outlined.  Recommended f/u in 09/2018.  Due this year. Discussed with her.        Hypercholesterolemia    Low cholesterol diet and exercise.  Follow lipid panel.       Relevant Orders   CBC with Differential/Platelet   Hepatic function panel   Lipid panel   Basic metabolic panel   CBC with Differential/Platelet (Completed)   Hepatic function panel (Completed)   Lipid panel (Completed)   Basic metabolic panel (Completed)   Hypothyroidism    On thyroid  replacement.  Follow tsh.       Relevant Orders   TSH   TSH (Completed)   Melanoma in situ [facility])    Has been followed by dermatology.       URI (upper respiratory infection)    With nasal and chest congestion as outlined.  Treat with saline nasal spray, steroid nasal spray and robitussin as directed.  Gave her a prescription for amoxicillin.  If symptoms progress, then will have if needed.  Will not take if does not need.  Follow.  Notify me if persistent symptoms.         Other Visit Diagnoses    Breast cancer screening    -  Primary   Relevant Orders   MM 3D SCREEN BREAST BILATERAL   Screening for cervical cancer       Relevant Orders   Cytology - PAP       [name], MD

## 2018-05-12 NOTE — Assessment & Plan Note (Addendum)
Physical today 05/12/18.  PAP 05/12/18.  Colonoscopy 2016.  Mammogram 06/10/17 - Birads I.  Information given for [place] to schedule mammogram.

## 2018-05-13 LAB — CBC WITH DIFFERENTIAL/PLATELET
BASOS ABS: 50 {cells}/uL (ref 0–200)
Basophils Relative: 0.6 %
EOS ABS: 101 {cells}/uL (ref 15–500)
Eosinophils Relative: 1.2 %
HCT: 42.2 % (ref 35.0–45.0)
Hemoglobin: 14 g/dL (ref 11.7–15.5)
Lymphs Abs: 1856 cells/uL (ref 850–3900)
MCH: 29.1 pg (ref 27.0–33.0)
MCHC: 33.2 g/dL (ref 32.0–36.0)
MCV: 87.7 fL (ref 80.0–100.0)
MONOS PCT: 6.2 %
MPV: 11.4 fL (ref 7.5–12.5)
Neutro Abs: 5872 cells/uL (ref 1500–7800)
Neutrophils Relative %: 69.9 %
PLATELETS: 253 10*3/uL (ref 140–400)
RBC: 4.81 10*6/uL (ref 3.80–5.10)
RDW: 12.6 % (ref 11.0–15.0)
TOTAL LYMPHOCYTE: 22.1 %
WBC mixed population: 521 cells/uL (ref 200–950)
WBC: 8.4 10*3/uL (ref 3.8–10.8)

## 2018-05-13 LAB — BASIC METABOLIC PANEL
BUN: 11 mg/dL (ref 7–25)
CALCIUM: 9.5 mg/dL (ref 8.6–10.4)
CHLORIDE: 101 mmol/L (ref 98–110)
CO2: 26 mmol/L (ref 20–32)
Creat: 0.88 mg/dL (ref 0.50–1.05)
GLUCOSE: 52 mg/dL — AB (ref 65–99)
Potassium: 4 mmol/L (ref 3.5–5.3)
SODIUM: 141 mmol/L (ref 135–146)

## 2018-05-13 LAB — HEPATIC FUNCTION PANEL
AG RATIO: 1.4 (calc) (ref 1.0–2.5)
ALT: 32 U/L — ABNORMAL HIGH (ref 6–29)
AST: 25 U/L (ref 10–35)
Albumin: 4.2 g/dL (ref 3.6–5.1)
Alkaline phosphatase (APISO): 74 U/L (ref 33–130)
BILIRUBIN INDIRECT: 0.3 mg/dL (ref 0.2–1.2)
Bilirubin, Direct: 0.1 mg/dL (ref 0.0–0.2)
Globulin: 2.9 g/dL (calc) (ref 1.9–3.7)
TOTAL PROTEIN: 7.1 g/dL (ref 6.1–8.1)
Total Bilirubin: 0.4 mg/dL (ref 0.2–1.2)

## 2018-05-13 LAB — LIPID PANEL
CHOLESTEROL: 230 mg/dL — AB (ref <200)
HDL: 60 mg/dL (ref >50)
LDL Cholesterol (Calc): 143 mg/dL (calc) — ABNORMAL HIGH
Non-HDL Cholesterol (Calc): 170 mg/dL (calc) — ABNORMAL HIGH (ref <130)
Total CHOL/HDL Ratio: 3.8 (calc) (ref <5.0)
Triglycerides: 141 mg/dL (ref <150)

## 2018-05-13 LAB — TSH: TSH: 2.34 m[IU]/L (ref 0.40–4.50)

## 2018-05-13 LAB — SPECIMEN COMPROMISED

## 2018-05-15 ENCOUNTER — Encounter: Payer: Self-pay | Admitting: Internal Medicine

## 2018-05-15 ENCOUNTER — Other Ambulatory Visit: Payer: Self-pay | Admitting: Internal Medicine

## 2018-05-15 DIAGNOSIS — J069 Acute upper respiratory infection, unspecified: Secondary | ICD-10-CM | POA: Insufficient documentation

## 2018-05-15 DIAGNOSIS — R945 Abnormal results of liver function studies: Secondary | ICD-10-CM

## 2018-05-15 DIAGNOSIS — R7989 Other specified abnormal findings of blood chemistry: Secondary | ICD-10-CM

## 2018-05-15 NOTE — Assessment & Plan Note (Signed)
Low cholesterol diet and exercise.  Follow lipid panel.   

## 2018-05-15 NOTE — Assessment & Plan Note (Signed)
With nasal and chest congestion as outlined.  Treat with saline nasal spray, steroid nasal spray and robitussin as directed.  Gave her a prescription for amoxicillin.  If symptoms progress, then will have if needed.  Will not take if does not need.  Follow.  Notify me if persistent symptoms.

## 2018-05-15 NOTE — Assessment & Plan Note (Signed)
On thyroid replacement.  Follow tsh.  

## 2018-05-15 NOTE — Assessment & Plan Note (Signed)
Has been followed by dermatology.  

## 2018-05-15 NOTE — Progress Notes (Signed)
Order placed for f/u lab.   

## 2018-05-15 NOTE — Assessment & Plan Note (Signed)
Colonoscopy 09/2015 as outlined.  Recommended f/u in 09/2018.  Due this year. Discussed with her.

## 2018-05-16 ENCOUNTER — Encounter: Payer: Self-pay | Admitting: Internal Medicine

## 2018-05-16 LAB — CYTOLOGY - PAP
Diagnosis: NEGATIVE
HPV (WINDOPATH): NOT DETECTED

## 2018-06-02 ENCOUNTER — Other Ambulatory Visit (INDEPENDENT_AMBULATORY_CARE_PROVIDER_SITE_OTHER): Payer: Self-pay

## 2018-06-02 DIAGNOSIS — R7989 Other specified abnormal findings of blood chemistry: Secondary | ICD-10-CM

## 2018-06-02 DIAGNOSIS — R945 Abnormal results of liver function studies: Secondary | ICD-10-CM

## 2018-06-02 LAB — HEPATIC FUNCTION PANEL
ALT: 15 U/L (ref 0–35)
AST: 20 U/L (ref 0–37)
Albumin: 4.2 g/dL (ref 3.5–5.2)
Alkaline Phosphatase: 57 U/L (ref 39–117)
BILIRUBIN DIRECT: 0 mg/dL (ref 0.0–0.3)
BILIRUBIN TOTAL: 0.4 mg/dL (ref 0.2–1.2)
Total Protein: 7.1 g/dL (ref 6.0–8.3)

## 2018-06-04 ENCOUNTER — Encounter: Payer: Self-pay | Admitting: Internal Medicine

## 2018-06-05 ENCOUNTER — Other Ambulatory Visit: Payer: Self-pay

## 2018-06-05 NOTE — Telephone Encounter (Signed)
Left message to call back and schedule.

## 2018-06-05 NOTE — Telephone Encounter (Signed)
See my chart.  She wants to schedule a time for her cholesterol to be rechecked.  Please schedule her for a fasting lab.  I would recommend October - to allow time in between checks.

## 2018-08-23 ENCOUNTER — Other Ambulatory Visit: Payer: Self-pay | Admitting: Internal Medicine

## 2018-08-23 ENCOUNTER — Telehealth: Payer: Self-pay | Admitting: Radiology

## 2018-08-23 DIAGNOSIS — E7800 Pure hypercholesterolemia, unspecified: Secondary | ICD-10-CM

## 2018-08-23 NOTE — Telephone Encounter (Signed)
Pt coming in for labs tomorrow, please place future orders. Thank you.  

## 2018-08-23 NOTE — Telephone Encounter (Signed)
Follow up labs ordered.

## 2018-08-23 NOTE — Progress Notes (Signed)
Labs ordered.

## 2018-08-24 ENCOUNTER — Other Ambulatory Visit (INDEPENDENT_AMBULATORY_CARE_PROVIDER_SITE_OTHER): Payer: Self-pay

## 2018-08-24 DIAGNOSIS — E7800 Pure hypercholesterolemia, unspecified: Secondary | ICD-10-CM

## 2018-08-25 ENCOUNTER — Other Ambulatory Visit: Payer: Self-pay

## 2018-08-25 LAB — LIPID PANEL
CHOLESTEROL: 201 mg/dL — AB (ref 0–200)
HDL: 71.1 mg/dL (ref >39.00)
LDL CALC: 114 mg/dL — AB (ref 0–99)
NonHDL: 130.18
TRIGLYCERIDES: 79 mg/dL (ref 0.0–149.0)
Total CHOL/HDL Ratio: 3
VLDL: 15.8 mg/dL (ref 0.0–40.0)

## 2018-08-25 LAB — BASIC METABOLIC PANEL
BUN: 20 mg/dL (ref 6–23)
CALCIUM: 9.5 mg/dL (ref 8.4–10.5)
CO2: 27 mEq/L (ref 19–32)
CREATININE: 1.02 mg/dL (ref 0.40–1.20)
Chloride: 102 mEq/L (ref 96–112)
GFR: 58.9 mL/min — ABNORMAL LOW (ref >60.00)
Glucose, Bld: 78 mg/dL (ref 70–99)
Potassium: 3.8 mEq/L (ref 3.5–5.1)
Sodium: 139 mEq/L (ref 135–145)

## 2018-08-25 LAB — HEPATIC FUNCTION PANEL
ALT: 12 U/L (ref 0–35)
AST: 18 U/L (ref 0–37)
Albumin: 4.4 g/dL (ref 3.5–5.2)
Alkaline Phosphatase: 61 U/L (ref 39–117)
BILIRUBIN DIRECT: 0.1 mg/dL (ref 0.0–0.3)
BILIRUBIN TOTAL: 0.6 mg/dL (ref 0.2–1.2)
TOTAL PROTEIN: 7.2 g/dL (ref 6.0–8.3)

## 2018-08-28 ENCOUNTER — Other Ambulatory Visit: Payer: Self-pay | Admitting: Internal Medicine

## 2018-08-28 DIAGNOSIS — R7989 Other specified abnormal findings of blood chemistry: Secondary | ICD-10-CM

## 2018-08-28 NOTE — Progress Notes (Signed)
Order placed for f/u met b.  

## 2018-09-06 ENCOUNTER — Other Ambulatory Visit: Payer: Self-pay | Admitting: Internal Medicine

## 2018-09-13 ENCOUNTER — Other Ambulatory Visit: Payer: Self-pay

## 2018-09-13 ENCOUNTER — Telehealth: Payer: Self-pay | Admitting: Internal Medicine

## 2018-09-13 MED ORDER — LEVOTHYROXINE SODIUM 50 MCG PO TABS
50.0000 ug | ORAL_TABLET | Freq: Every day | ORAL | 1 refills | Status: DC
Start: 2018-09-13 — End: 2019-03-12

## 2018-09-13 NOTE — Telephone Encounter (Signed)
rx sent to [facility]

## 2018-09-13 NOTE — Telephone Encounter (Signed)
Copied from [place] [phone]. Topic: Quick Communication - See Telephone Encounter >> Sep 13, 2018  1:46 PM Bea Graff, [place] wrote: CRM for notification. See Telephone encounter for: 09/13/18. Pt would like to see if levothyroxine (SYNTHROID, LEVOTHROID) 50 MCG tablet can be sent to [facility]? [place], [place] - [address] [phone] (Phone) [phone] (Fax)

## 2018-09-19 ENCOUNTER — Other Ambulatory Visit (INDEPENDENT_AMBULATORY_CARE_PROVIDER_SITE_OTHER): Payer: Self-pay

## 2018-09-19 DIAGNOSIS — R7989 Other specified abnormal findings of blood chemistry: Secondary | ICD-10-CM

## 2018-09-19 LAB — BASIC METABOLIC PANEL
BUN: 19 mg/dL (ref 6–23)
CHLORIDE: 101 meq/L (ref 96–112)
CO2: 28 mEq/L (ref 19–32)
Calcium: 9.3 mg/dL (ref 8.4–10.5)
Creatinine, Ser: 0.96 mg/dL (ref 0.40–1.20)
GFR: 63.15 mL/min (ref >60.00)
Glucose, Bld: 87 mg/dL (ref 70–99)
POTASSIUM: 4.1 meq/L (ref 3.5–5.1)
Sodium: 136 mEq/L (ref 135–145)

## 2018-09-20 ENCOUNTER — Encounter: Payer: Self-pay | Admitting: Internal Medicine

## 2018-12-08 ENCOUNTER — Telehealth: Payer: Self-pay | Admitting: Internal Medicine

## 2018-12-08 DIAGNOSIS — Z1231 Encounter for screening mammogram for malignant neoplasm of breast: Secondary | ICD-10-CM

## 2018-12-08 NOTE — Telephone Encounter (Signed)
Mammogram already ordered

## 2018-12-08 NOTE — Telephone Encounter (Signed)
Spoke with pt. She advised she would like for me to schedule mammo. Left message for [place] to call me back to schedule

## 2018-12-08 NOTE — Telephone Encounter (Signed)
Copied from [place] [phone]. Topic: Quick Communication - See Telephone Encounter >> Dec 08, 2018 11:49 AM Antonieta Iba C wrote: CRM for notification. See Telephone encounter for: 12/08/18.  Pt called in requesting to have orders placed for a mammogram. Pt would like to go to [facility]

## 2018-12-14 ENCOUNTER — Encounter: Payer: Self-pay | Admitting: Internal Medicine

## 2018-12-15 ENCOUNTER — Encounter: Payer: Self-pay | Admitting: Family Medicine

## 2018-12-15 ENCOUNTER — Ambulatory Visit: Payer: Self-pay | Admitting: Family Medicine

## 2018-12-15 DIAGNOSIS — R6889 Other general symptoms and signs: Secondary | ICD-10-CM

## 2018-12-15 MED ORDER — BENZONATATE 200 MG PO CAPS: 200.0000 mg | ORAL_CAPSULE | Freq: Three times a day (TID) | ORAL | 1 refills | Status: DC | PRN

## 2018-12-15 MED ORDER — ALBUTEROL SULFATE HFA 108 (90 BASE) MCG/ACT IN AERS: 1.0000 | INHALATION_SPRAY | Freq: Four times a day (QID) | RESPIRATORY_TRACT | 0 refills | Status: DC | PRN

## 2018-12-15 MED ORDER — OSELTAMIVIR PHOSPHATE 75 MG PO CAPS: 75.0000 mg | ORAL_CAPSULE | Freq: Two times a day (BID) | ORAL | 0 refills | Status: DC

## 2018-12-15 NOTE — Progress Notes (Signed)
Sx started <48 hours ago.  Cough came on quickly.  Didn't sleep well.  Aches.  Then had a fever in the meantime, up to 101.  More cough.  Chest can feel tight.  Sick contacts at work.  No vomiting. No diarrhea.  No ear pain.  Some HA.  No ST.    Had a flu shot 09/2018.    Meds, vitals, and allergies reviewed.   ROS: Per HPI unless specifically indicated in ROS section   GEN: nad, alert and oriented HEENT: mucous membranes moist, tm w/o erythema, nasal exam w/o erythema, clear discharge noted,  OP with cobblestoning NECK: supple w/o LA CV: rrr.   PULM: ctab, no inc wob EXT: no edema SKIN: no acute rash

## 2018-12-15 NOTE — Patient Instructions (Signed)
Presumed flu, start tamiflu.  Rest and fluids.  Use tessalon vs albuterol vs both for the cough.  Update [place] as needed.  Take care.  Glad to see you.

## 2018-12-15 NOTE — Telephone Encounter (Signed)
Pt seeing Dr Damita Dunnings this afternoon and scheduled for mammogram. Aware of date/time

## 2018-12-17 DIAGNOSIS — R6889 Other general symptoms and signs: Secondary | ICD-10-CM | POA: Insufficient documentation

## 2018-12-17 NOTE — Assessment & Plan Note (Addendum)
Presumed flu, start tamiflu.  Rest and fluids.  Use tessalon vs albuterol vs both for the cough.  Update [place] as needed.  Flu testing deferred deferred at this point since it would likely not change management.  I would treat her even if negative testing due to a possible false negative.  Discussed with patient.  She agreed. Okay for outpatient follow-up.  Lungs are clear.  No evidence of pneumonia on exam.

## 2018-12-26 ENCOUNTER — Ambulatory Visit
Admission: RE | Admit: 2018-12-26 | Discharge: 2018-12-26 | Disposition: A | Discharge status: Home / Self Care | Payer: Self-pay | Source: Ambulatory Visit | Attending: Internal Medicine | Admitting: Internal Medicine

## 2018-12-26 DIAGNOSIS — Z1239 Encounter for other screening for malignant neoplasm of breast: Secondary | ICD-10-CM | POA: Insufficient documentation

## 2019-02-26 ENCOUNTER — Encounter: Payer: Self-pay | Admitting: Internal Medicine

## 2019-03-12 ENCOUNTER — Other Ambulatory Visit: Payer: Self-pay | Admitting: Internal Medicine

## 2019-03-14 ENCOUNTER — Other Ambulatory Visit: Payer: Self-pay

## 2019-03-14 ENCOUNTER — Encounter: Payer: Self-pay | Admitting: Internal Medicine

## 2019-03-14 ENCOUNTER — Other Ambulatory Visit: Payer: Self-pay | Admitting: Internal Medicine

## 2019-03-14 MED ORDER — LEVOTHYROXINE SODIUM 50 MCG PO TABS: ORAL_TABLET | ORAL | 0 refills | Status: DC

## 2019-03-15 ENCOUNTER — Encounter: Payer: Self-pay | Admitting: Internal Medicine

## 2019-03-15 ENCOUNTER — Ambulatory Visit (INDEPENDENT_AMBULATORY_CARE_PROVIDER_SITE_OTHER): Payer: Self-pay | Admitting: Internal Medicine

## 2019-03-15 ENCOUNTER — Other Ambulatory Visit: Payer: Self-pay

## 2019-03-15 DIAGNOSIS — L299 Pruritus, unspecified: Secondary | ICD-10-CM

## 2019-03-15 DIAGNOSIS — D039 Melanoma in situ, unspecified: Secondary | ICD-10-CM

## 2019-03-15 DIAGNOSIS — E7800 Pure hypercholesterolemia, unspecified: Secondary | ICD-10-CM

## 2019-03-15 DIAGNOSIS — E039 Hypothyroidism, unspecified: Secondary | ICD-10-CM

## 2019-03-15 MED ORDER — LEVOTHYROXINE SODIUM 50 MCG PO TABS: ORAL_TABLET | ORAL | 1 refills | Status: DC

## 2019-03-15 MED ORDER — TRIAMCINOLONE ACETONIDE 0.1 % EX CREA: 1.0000 "application " | TOPICAL_CREAM | Freq: Two times a day (BID) | CUTANEOUS | 0 refills | Status: DC

## 2019-03-15 NOTE — Progress Notes (Signed)
Patient ID: Michelle Roach, female   DOB: 13-May-1961, 58 y.o.   MRN: [id number] Virtual Visit via [email or web address] Note  This visit type was conducted due to national recommendations for restrictions regarding the COVID-19 pandemic (e.g. social distancing).  This format is felt to be most appropriate for this patient at this time.  All issues noted in this document were discussed and addressed.  No physical exam was performed (except for noted visual exam findings with Video Visits).   I connected with Michelle Roach by a video enabled telemedicine application or telephone and verified that I am speaking with the correct person using two identifiers. Location patient: home Location provider: work Persons participating in the virtual visit: patient, provider  I discussed the limitations, risks, security and privacy concerns of performing an evaluation and management service by video and the availability of in person appointments. The patient expressed understanding and agreed to proceed.  Interactive audio and video telecommunications were attempted between this provider and patient, however failed, due to technical difficulties.  We continued and completed visit with audio only.   Reason for visit: follow up appt  HPI: She reports she is doing relatively well.  Reports noticing some itching on her thigh.  Feels like it is under skin.  No rash.  States localized to a round area on her thigh - 2x2.  Question if brownish color.  No erythema.  No increased warmth.  Present for months.  No fever.  No other areas of itching.  No known triggers.  Saw [facility] in the fall.  Still unclear etiology.  She was concerned and was questioning if further w/up warranted.  Has noticed bruises easier.  Stays active.  No chest pain.  No sob.  No cough or congestion.  No known COVID exposure.  No abdominal pain or cramping.  Bowels stable.     ROS: See pertinent positives and negatives per HPI.  Past Medical  History:  Diagnosis Date  . History of colon polyps   . 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 TONSILLECTOMY  1966  . TUBAL LIGATION  1994    Family History  Problem Relation Age of Onset  . Hypertension Mother   . Diabetes Mother   . Cancer Father        colon  . Diabetes Maternal Grandmother   . Breast cancer Neg Hx     SOCIAL HX: reviewed.     Current Outpatient Medications:  .  albuterol (PROVENTIL HFA;VENTOLIN HFA) 108 (90 Base) MCG/ACT inhaler, Inhale 1-2 puffs into the lungs every 6 (six) hours as needed., Disp: 1 Inhaler, Rfl: 0 .  aspirin 81 MG tablet, Take 81 mg by mouth daily., Disp: , Rfl:  .  benzonatate (TESSALON) 200 MG capsule, Take 1 capsule (200 mg total) by mouth 3 (three) times daily as needed., Disp: 30 capsule, Rfl: 1 .  levothyroxine (EUTHYROX) 50 MCG tablet, TAKE 1 TABLET BY MOUTH ONCE DAILY BEFORE BREAKFAST, Disp: 90 tablet, Rfl: 1 .  Omega-3 Fatty Acids (FISH OIL) 1000 MG CAPS, Take by mouth., Disp: , Rfl:  .  oseltamivir (TAMIFLU) 75 MG capsule, Take 1 capsule (75 mg total) by mouth 2 (two) times daily., Disp: 10 capsule, Rfl: 0 .  triamcinolone cream (KENALOG) 0.1 %, Apply 1 application topically 2 (two) times daily., Disp: 30 g, Rfl: 0  EXAM:  GENERAL: alert.  Answering questions appropriately.  Sounds to be  in no acute distress.    PSYCH/NEURO: pleasant and cooperative, no obvious depression or anxiety, speech and thought processing grossly intact  ASSESSMENT AND PLAN:  Discussed the following assessment and plan:  Hypercholesterolemia - Plan: CBC with Differential/Platelet, Hepatic function panel, Lipid panel, Basic metabolic panel  Hypothyroidism, unspecified type - Plan: TSH  Melanoma in situ, unspecified site (HCC)  Itching  Hypercholesterolemia Low cholesterol diet and exercise.  Follow lipid panel.   Hypothyroidism On thyroid replacement.  Follow tsh.   Melanoma in  situ Has been followed by dermatology.   Itching Localized to one area.  No rash reported.  Triamcinolone cream as directed.  Check labs.  Follow.      I discussed the assessment and treatment plan with the patient. The patient was provided an opportunity to ask questions and all were answered. The patient agreed with the plan and demonstrated an understanding of the instructions.   The patient was advised to call back or seek an in-person evaluation if the symptoms worsen or if the condition fails to improve as anticipated.  I provided 15 minutes of non-face-to-face time during this encounter.   [name], MD

## 2019-03-16 ENCOUNTER — Telehealth: Payer: Self-pay

## 2019-03-16 NOTE — Telephone Encounter (Signed)
Left message for patient to call back and schedule lab appt next week

## 2019-03-16 NOTE — Telephone Encounter (Signed)
-----   Message from [name], MD sent at 03/15/2019  4:34 PM EDT ----- Regarding: lab appt Please make pt a fasting lab appt for next week and notify her if appt date and time.    Thanks  Dr Nicki Reaper

## 2019-03-18 ENCOUNTER — Encounter: Payer: Self-pay | Admitting: Internal Medicine

## 2019-03-18 DIAGNOSIS — L299 Pruritus, unspecified: Secondary | ICD-10-CM | POA: Insufficient documentation

## 2019-03-18 NOTE — Assessment & Plan Note (Signed)
On thyroid replacement.  Follow tsh.  

## 2019-03-18 NOTE — Assessment & Plan Note (Signed)
Has been followed by dermatology.  

## 2019-03-18 NOTE — Assessment & Plan Note (Signed)
Low cholesterol diet and exercise.  Follow lipid panel.   

## 2019-03-18 NOTE — Assessment & Plan Note (Signed)
Localized to one area.  No rash reported.  Triamcinolone cream as directed.  Check labs.  Follow.

## 2019-03-21 ENCOUNTER — Other Ambulatory Visit (INDEPENDENT_AMBULATORY_CARE_PROVIDER_SITE_OTHER): Payer: Self-pay

## 2019-03-21 ENCOUNTER — Other Ambulatory Visit: Payer: Self-pay

## 2019-03-21 DIAGNOSIS — E7800 Pure hypercholesterolemia, unspecified: Secondary | ICD-10-CM

## 2019-03-21 DIAGNOSIS — E039 Hypothyroidism, unspecified: Secondary | ICD-10-CM

## 2019-03-21 LAB — LIPID PANEL
Cholesterol: 246 mg/dL — ABNORMAL HIGH (ref 0–200)
HDL: 69.3 mg/dL (ref >39.00)
LDL Cholesterol: 155 mg/dL — ABNORMAL HIGH (ref 0–99)
NonHDL: 176.37
Total CHOL/HDL Ratio: 4
Triglycerides: 108 mg/dL (ref 0.0–149.0)
VLDL: 21.6 mg/dL (ref 0.0–40.0)

## 2019-03-21 LAB — CBC WITH DIFFERENTIAL/PLATELET
Basophils Absolute: 0 10*3/uL (ref 0.0–0.1)
Basophils Relative: 0.5 % (ref 0.0–3.0)
Eosinophils Absolute: 0.1 10*3/uL (ref 0.0–0.7)
Eosinophils Relative: 2.3 % (ref 0.0–5.0)
HCT: 44.9 % (ref 36.0–46.0)
Hemoglobin: 15 g/dL (ref 12.0–15.0)
Lymphocytes Relative: 36.3 % (ref 12.0–46.0)
Lymphs Abs: 2 10*3/uL (ref 0.7–4.0)
MCHC: 33.5 g/dL (ref 30.0–36.0)
MCV: 88.9 fl (ref 78.0–100.0)
Monocytes Absolute: 0.5 10*3/uL (ref 0.1–1.0)
Monocytes Relative: 9 % (ref 3.0–12.0)
Neutro Abs: 2.8 10*3/uL (ref 1.4–7.7)
Neutrophils Relative %: 51.9 % (ref 43.0–77.0)
Platelets: 148 10*3/uL — ABNORMAL LOW (ref 150.0–400.0)
RBC: 5.05 Mil/uL (ref 3.87–5.11)
RDW: 14.6 % (ref 11.5–15.5)
WBC: 5.5 10*3/uL (ref 4.0–10.5)

## 2019-03-21 LAB — BASIC METABOLIC PANEL
BUN: 18 mg/dL (ref 6–23)
CO2: 28 mEq/L (ref 19–32)
Calcium: 9.2 mg/dL (ref 8.4–10.5)
Chloride: 102 mEq/L (ref 96–112)
Creatinine, Ser: 0.9 mg/dL (ref 0.40–1.20)
GFR: 63.9 mL/min (ref >60.00)
Glucose, Bld: 80 mg/dL (ref 70–99)
Potassium: 4.1 mEq/L (ref 3.5–5.1)
Sodium: 138 mEq/L (ref 135–145)

## 2019-03-21 LAB — HEPATIC FUNCTION PANEL
ALT: 16 U/L (ref 0–35)
AST: 19 U/L (ref 0–37)
Albumin: 4.5 g/dL (ref 3.5–5.2)
Alkaline Phosphatase: 56 U/L (ref 39–117)
Bilirubin, Direct: 0.1 mg/dL (ref 0.0–0.3)
Total Bilirubin: 0.4 mg/dL (ref 0.2–1.2)
Total Protein: 7 g/dL (ref 6.0–8.3)

## 2019-03-21 LAB — TSH: TSH: 3.76 u[IU]/mL (ref 0.35–4.50)

## 2019-03-23 ENCOUNTER — Other Ambulatory Visit: Payer: Self-pay | Admitting: Internal Medicine

## 2019-03-23 DIAGNOSIS — D696 Thrombocytopenia, unspecified: Secondary | ICD-10-CM

## 2019-03-23 NOTE — Progress Notes (Signed)
Order placed for f/u platelet count.  

## 2019-03-27 ENCOUNTER — Other Ambulatory Visit: Payer: Self-pay | Admitting: Internal Medicine

## 2019-03-27 DIAGNOSIS — R203 Hyperesthesia: Secondary | ICD-10-CM

## 2019-03-27 NOTE — Progress Notes (Signed)
Order placed for neurology referral.   

## 2019-04-24 ENCOUNTER — Telehealth: Payer: Self-pay

## 2019-04-24 NOTE — Telephone Encounter (Signed)
She just needs to reschedule her lab appt. Do you mind calling her?

## 2019-04-24 NOTE — Telephone Encounter (Signed)
Called and rescheduled lab appt

## 2019-04-24 NOTE — Telephone Encounter (Signed)
Copied from [place] [phone]. Topic: Appointment Scheduling - Scheduling Inquiry for Clinic >> Apr 24, 2019 11:01 AM Yvette Rack wrote: Reason for CRM: Pt called in to reschedule the appt for 04/27/19. Attempted to transfer pt to office but there was no answer after several attempts. Pt requests that the 04/27/19 appt be cancelled and she would like a call back to reschedule.

## 2019-04-27 ENCOUNTER — Other Ambulatory Visit: Payer: Self-pay

## 2019-05-03 ENCOUNTER — Other Ambulatory Visit: Payer: Self-pay

## 2019-05-03 ENCOUNTER — Other Ambulatory Visit (INDEPENDENT_AMBULATORY_CARE_PROVIDER_SITE_OTHER): Payer: Self-pay

## 2019-05-03 DIAGNOSIS — D696 Thrombocytopenia, unspecified: Secondary | ICD-10-CM

## 2019-05-04 ENCOUNTER — Encounter: Payer: Self-pay | Admitting: Internal Medicine

## 2019-05-04 LAB — PLATELET COUNT: Platelets: 183 10*3/uL (ref 140–400)

## 2019-05-22 ENCOUNTER — Telehealth: Payer: Self-pay

## 2019-05-22 NOTE — Telephone Encounter (Signed)
Lm to call back

## 2019-05-22 NOTE — Telephone Encounter (Signed)
Copied from [place] [phone]. Topic: Appointment Scheduling - Scheduling Inquiry for Clinic >> May 22, 2019 12:44 PM Mathis Bud wrote: Reason for CRM: patient is calling back "Caryl Pina" regarding her upcoming appt 7/10. Patient will be done around 3 with work Call back # [phone]

## 2019-05-25 ENCOUNTER — Other Ambulatory Visit: Payer: Self-pay

## 2019-05-25 ENCOUNTER — Ambulatory Visit (INDEPENDENT_AMBULATORY_CARE_PROVIDER_SITE_OTHER): Payer: Self-pay | Admitting: Internal Medicine

## 2019-05-25 ENCOUNTER — Encounter: Payer: Self-pay | Admitting: Internal Medicine

## 2019-05-25 VITALS — BP 122/78 | HR 69 | Temp 98.5°F | Resp 16 | Ht 64.0 in | Wt 137.0 lb

## 2019-05-25 DIAGNOSIS — E7800 Pure hypercholesterolemia, unspecified: Secondary | ICD-10-CM

## 2019-05-25 DIAGNOSIS — L299 Pruritus, unspecified: Secondary | ICD-10-CM

## 2019-05-25 DIAGNOSIS — Z86010 Personal history of colonic polyps: Secondary | ICD-10-CM

## 2019-05-25 DIAGNOSIS — D039 Melanoma in situ, unspecified: Secondary | ICD-10-CM

## 2019-05-25 DIAGNOSIS — Z0000 Encounter for general adult medical examination without abnormal findings: Secondary | ICD-10-CM

## 2019-05-25 DIAGNOSIS — E039 Hypothyroidism, unspecified: Secondary | ICD-10-CM

## 2019-05-25 MED ORDER — LEVOTHYROXINE SODIUM 50 MCG PO TABS: ORAL_TABLET | ORAL | 2 refills | Status: DC

## 2019-05-25 NOTE — Progress Notes (Signed)
Patient ID: Jimma Ortman, female   DOB: Jun 05, 1961, 58 y.o.   MRN: [id number]   Subjective:    Patient ID: Marketta Valadez, female    DOB: [birth year], 58 y.o.   MRN: [id number]  HPI  Patient here for her physical exam.  She reports she is doing relatively well.  Trying to stay active.  No chest pain.  No sob.  No acid reflux.  No abdominal pain.  Bowels moving.  Previous itching lateral thigh.  Has improved.  Some itching noticed right hip.  No sensation change.  She saw dermatology.  Discussed further w/up.  She declines since improving.  No rash.  Discussed recent labs.  Discussed low cholesterol diet and exercise.  Discussed due colonoscopy.  She is questioning cost.  Has postponed secondary to this. Handling stress.      Past Medical History:  Diagnosis Date  . History of colon polyps   . 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 TONSILLECTOMY  1966  . TUBAL LIGATION  1994   Family History  Problem Relation Age of Onset  . Hypertension Mother   . Diabetes Mother   . Cancer Father        colon  . Diabetes Maternal Grandmother   . Breast cancer Neg Hx    Social History   Socioeconomic History  . Marital status: Married    Spouse name: Not on file  . Number of children: Not on file  . Years of education: Not on file  . Highest education level: Not on file  Occupational History  . Not on file  Social Needs  . Financial resource strain: Not on file  . Food insecurity    Worry: Not on file    Inability: Not on file  . Transportation needs    Medical: Not on file    Non-medical: Not on file  Tobacco Use  . Smoking status: Never Smoker  . Smokeless tobacco: Never Used  Substance and Sexual Activity  . Alcohol use: Yes    Alcohol/week: 0.0 standard drinks    Comment: rarely  . Drug use: No  . Sexual activity: Not on file  Lifestyle  . Physical activity    Days per week: Not on file    Minutes per session: Not on  file  . Stress: Not on file  Relationships  . Social Herbalist on phone: Not on file    Gets together: Not on file    Attends religious service: Not on file    Active member of club or organization: Not on file    Attends meetings of clubs or organizations: Not on file    Relationship status: Not on file  Other Topics Concern  . Not on file  Social History Narrative  . Not on file    Outpatient Encounter Medications as of 05/25/2019  Medication Sig  . Red Yeast Rice Extract (RED YEAST RICE PO) Take by mouth.  . levothyroxine (EUTHYROX) 50 MCG tablet TAKE 1 TABLET BY MOUTH ONCE DAILY BEFORE BREAKFAST  . Omega-3 Fatty Acids (FISH OIL) 1000 MG CAPS Take by mouth.  . triamcinolone cream (KENALOG) 0.1 % Apply 1 application topically 2 (two) times daily.  . [DISCONTINUED] albuterol (PROVENTIL HFA;VENTOLIN HFA) 108 (90 Base) MCG/ACT inhaler Inhale 1-2 puffs into the lungs every 6 (six) hours as needed.  . [DISCONTINUED] aspirin 81 MG tablet Take 81 mg by mouth  daily.  . [DISCONTINUED] benzonatate (TESSALON) 200 MG capsule Take 1 capsule (200 mg total) by mouth 3 (three) times daily as needed.  . [DISCONTINUED] levothyroxine (EUTHYROX) 50 MCG tablet TAKE 1 TABLET BY MOUTH ONCE DAILY BEFORE BREAKFAST  . [DISCONTINUED] oseltamivir (TAMIFLU) 75 MG capsule Take 1 capsule (75 mg total) by mouth 2 (two) times daily.   No facility-administered encounter medications on file as of 05/25/2019.     Review of Systems  Constitutional: Negative for appetite change and unexpected weight change.  HENT: Negative for congestion and sinus pressure.   Eyes: Negative for pain and visual disturbance.  Respiratory: Negative for cough, chest tightness and shortness of breath.   Cardiovascular: Negative for chest pain, palpitations and leg swelling.  Gastrointestinal: Negative for abdominal pain, diarrhea, nausea and vomiting.  Genitourinary: Negative for difficulty urinating and dysuria.   Musculoskeletal: Negative for joint swelling and myalgias.  Skin: Negative for color change and rash.  Neurological: Negative for dizziness, light-headedness and headaches.  Hematological: Negative for adenopathy. Does not bruise/bleed easily.  Psychiatric/Behavioral: Negative for agitation and dysphoric mood.       Objective:    Physical Exam Constitutional:      General: She is not in acute distress.    Appearance: Normal appearance. She is well-developed.  HENT:     Right Ear: External ear normal.     Left Ear: External ear normal.  Eyes:     General: No scleral icterus.       Right eye: No discharge.        Left eye: No discharge.     Conjunctiva/sclera: Conjunctivae normal.  Neck:     Musculoskeletal: Neck supple. No muscular tenderness.     Thyroid: No thyromegaly.  Cardiovascular:     Rate and Rhythm: Normal rate and regular rhythm.  Pulmonary:     Effort: No tachypnea, accessory muscle usage or respiratory distress.     Breath sounds: Normal breath sounds. No decreased breath sounds or wheezing.  Chest:     Breasts:        Right: No inverted nipple, mass, nipple discharge or tenderness (no axillary adenopathy).        Left: No inverted nipple, mass, nipple discharge or tenderness (no axilarry adenopathy).  Abdominal:     General: Bowel sounds are normal.     Palpations: Abdomen is soft.     Tenderness: There is no abdominal tenderness.  Musculoskeletal:        General: No swelling or tenderness.  Lymphadenopathy:     Cervical: No cervical adenopathy.  Skin:    Findings: No erythema or rash.  Neurological:     Mental Status: She is alert and oriented to person, place, and time.  Psychiatric:        Mood and Affect: Mood normal.        Behavior: Behavior normal.     BP 122/78   Pulse 69   Temp 98.5 F (36.9 C) (Oral)   Resp 16   Ht 5\' 4"  (1.626 m)   Wt 137 lb (62.1 kg)   LMP 02/02/2009   SpO2 98%   BMI 23.52 kg/m  Wt Readings from Last 3  Encounters:  05/25/19 137 lb (62.1 kg)  05/12/18 128 lb (58.1 kg)  08/10/17 138 lb 2 oz (62.7 kg)     Lab Results  Component Value Date   WBC 5.5 03/21/2019   HGB 15.0 03/21/2019   HCT 44.9 03/21/2019   PLT 183 05/03/2019  GLUCOSE 80 03/21/2019   CHOL 246 (H) 03/21/2019   TRIG 108.0 03/21/2019   HDL 69.30 03/21/2019   LDLDIRECT 147.0 06/08/2013   LDLCALC 155 (H) 03/21/2019   ALT 16 03/21/2019   AST 19 03/21/2019   NA 138 03/21/2019   K 4.1 03/21/2019   CL 102 03/21/2019   CREATININE 0.90 03/21/2019   BUN 18 03/21/2019   CO2 28 03/21/2019   TSH 3.76 03/21/2019   INR 1.0 08/10/2013    Mm 3d Screen Breast Bilateral  Result Date: 12/26/2018 CLINICAL DATA:  Screening. EXAM: DIGITAL SCREENING BILATERAL MAMMOGRAM WITH TOMO AND CAD COMPARISON:  Previous exam(s). ACR Breast Density Category b: There are scattered areas of fibroglandular density. FINDINGS: There are no findings suspicious for malignancy. Images were processed with CAD. IMPRESSION: No mammographic evidence of malignancy. A result letter of this screening mammogram will be mailed directly to the patient. RECOMMENDATION: Screening mammogram in one year. (Code:SM-B-01Y) BI-RADS CATEGORY  1: Negative. Electronically Signed   By: Fidela Salisbury M.D.   On: 12/26/2018 16:13       Assessment & Plan:   Problem List Items Addressed This Visit    Health care maintenance    Physical today 05/25/19.  PAP 05/12/18 - negative with negative HPV.  Mammogram 12/26/18 -Birads I.  Colonoscopy 2016.  Discuss with GI regarding cost of colonoscopy.        History of colonic polyps    Last colonoscopy 09/2015 as outlined.  Recommended f/u in 09/2018.  Overdue.  Discuss with GI regarding cost.        Hypercholesterolemia    Discussed recent cholesterol labs.  Low cholesterol diet and exercise.  Follow lipid panel.       Hypothyroidism    On thyroid replacement.  Follow tsh.        Relevant Medications   levothyroxine  (EUTHYROX) 50 MCG tablet   Itching    Improved.  Desires no further w/up.  Follow.        Melanoma in situ [facility])    Followed by dermatology.            [name], MD

## 2019-05-25 NOTE — Assessment & Plan Note (Addendum)
Physical today 05/25/19.  PAP 05/12/18 - negative with negative HPV.  Mammogram 12/26/18 -[facility] I.  Colonoscopy 2016.  Discuss with GI regarding cost of colonoscopy.

## 2019-05-28 ENCOUNTER — Encounter: Payer: Self-pay | Admitting: Internal Medicine

## 2019-05-28 NOTE — Assessment & Plan Note (Signed)
Last colonoscopy 09/2015 as outlined.  Recommended f/u in 09/2018.  Overdue.  Discuss with GI regarding cost.

## 2019-05-28 NOTE — Assessment & Plan Note (Signed)
Followed by dermatology

## 2019-05-28 NOTE — Assessment & Plan Note (Signed)
Improved.  Desires no further w/up.  Follow.

## 2019-05-28 NOTE — Assessment & Plan Note (Signed)
On thyroid replacement.  Follow tsh.  

## 2019-05-28 NOTE — Assessment & Plan Note (Signed)
Discussed recent cholesterol labs.  Low cholesterol diet and exercise.  Follow lipid panel.

## 2019-05-31 ENCOUNTER — Other Ambulatory Visit: Payer: Self-pay

## 2019-05-31 ENCOUNTER — Encounter: Payer: Self-pay | Admitting: Internal Medicine

## 2019-05-31 MED ORDER — LEVOTHYROXINE SODIUM 50 MCG PO TABS: ORAL_TABLET | ORAL | 2 refills | Status: DC

## 2019-11-26 ENCOUNTER — Telehealth: Payer: Self-pay | Admitting: Internal Medicine

## 2019-11-26 NOTE — Telephone Encounter (Signed)
Pt took her BP today, her BP is 140/95, this has been going on for  About 3 days. No appts available until Friday.

## 2019-11-26 NOTE — Telephone Encounter (Signed)
I do not mind placing order for referral.  Need to know specific symptoms pt having - to place order for referral.  Also, if blood pressure remains elevated, will need medication.

## 2019-11-26 NOTE — Telephone Encounter (Signed)
Called patient. Confirmed no acute symptoms at this time. Has noticed her BP is running higher than normal- 140/95. Not on any BP meds. Offered virtual visit to discuss. Pt declined. Advised that we are not seeing patients in the office right now. Pt stated virtual visit was a waste and did not see the point. Requested referral to cardiology- she was referred last year and never went. Advised that if she develops any acute symptoms she will need to be evaluated. Advised that I would send message over for review.

## 2019-11-28 NOTE — Telephone Encounter (Signed)
Patient confirmed not having any symptoms right now and checked her BP today. 120/78. She would like to hold on referral. She is going to spot check pressures and send in readings then decide on Cardiology referral

## 2019-12-13 ENCOUNTER — Telehealth: Payer: Self-pay | Admitting: Internal Medicine

## 2019-12-13 NOTE — Telephone Encounter (Signed)
Manufacturer changed.

## 2019-12-13 NOTE — Telephone Encounter (Signed)
Patient called and is at [facility] to pick up her levothyroxine (EUTHYROX) 50 MCG tablet. [facility] has changed manufacture on this medication and needs approval from Dr. Nicki Reaper to fill.

## 2019-12-13 NOTE — Telephone Encounter (Signed)
She needs the prescription to go to [facility] on [address].

## 2020-03-13 ENCOUNTER — Telehealth: Payer: Self-pay | Admitting: Internal Medicine

## 2020-03-13 MED ORDER — LEVOTHYROXINE SODIUM 50 MCG PO TABS: ORAL_TABLET | ORAL | 2 refills | Status: DC

## 2020-03-13 NOTE — Telephone Encounter (Signed)
Pt needs 90 day supply of levothyroxine (EUTHYROX) 50 MCG tablet

## 2020-06-05 ENCOUNTER — Other Ambulatory Visit: Payer: Self-pay

## 2020-06-05 ENCOUNTER — Encounter: Payer: Self-pay | Admitting: Internal Medicine

## 2020-06-05 ENCOUNTER — Ambulatory Visit (INDEPENDENT_AMBULATORY_CARE_PROVIDER_SITE_OTHER): Payer: Self-pay | Admitting: Internal Medicine

## 2020-06-05 VITALS — BP 120/78 | HR 65 | Temp 98.0°F | Resp 16 | Ht 64.0 in | Wt 138.4 lb

## 2020-06-05 DIAGNOSIS — Z86006 Personal history of melanoma in-situ: Secondary | ICD-10-CM

## 2020-06-05 DIAGNOSIS — Z0000 Encounter for general adult medical examination without abnormal findings: Secondary | ICD-10-CM

## 2020-06-05 DIAGNOSIS — Z86010 Personal history of colonic polyps: Secondary | ICD-10-CM

## 2020-06-05 DIAGNOSIS — E039 Hypothyroidism, unspecified: Secondary | ICD-10-CM

## 2020-06-05 DIAGNOSIS — Z1211 Encounter for screening for malignant neoplasm of colon: Secondary | ICD-10-CM

## 2020-06-05 DIAGNOSIS — E7800 Pure hypercholesterolemia, unspecified: Secondary | ICD-10-CM

## 2020-06-05 DIAGNOSIS — D649 Anemia, unspecified: Secondary | ICD-10-CM

## 2020-06-05 LAB — COMPREHENSIVE METABOLIC PANEL
ALT: 13 U/L (ref 0–35)
AST: 20 U/L (ref 0–37)
Albumin: 4.5 g/dL (ref 3.5–5.2)
Alkaline Phosphatase: 47 U/L (ref 39–117)
BUN: 19 mg/dL (ref 6–23)
CO2: 29 mEq/L (ref 19–32)
Calcium: 9.3 mg/dL (ref 8.4–10.5)
Chloride: 102 mEq/L (ref 96–112)
Creatinine, Ser: 0.94 mg/dL (ref 0.40–1.20)
GFR: 60.53 mL/min (ref >60.00)
Glucose, Bld: 85 mg/dL (ref 70–99)
Potassium: 4.1 mEq/L (ref 3.5–5.1)
Sodium: 138 mEq/L (ref 135–145)
Total Bilirubin: 0.4 mg/dL (ref 0.2–1.2)
Total Protein: 7 g/dL (ref 6.0–8.3)

## 2020-06-05 LAB — CBC WITH DIFFERENTIAL/PLATELET
Basophils Absolute: 0 10*3/uL (ref 0.0–0.1)
Basophils Relative: 0.6 % (ref 0.0–3.0)
Eosinophils Absolute: 0 10*3/uL (ref 0.0–0.7)
Eosinophils Relative: 1 % (ref 0.0–5.0)
HCT: 38.3 % (ref 36.0–46.0)
Hemoglobin: 12.7 g/dL (ref 12.0–15.0)
Lymphocytes Relative: 27.5 % (ref 12.0–46.0)
Lymphs Abs: 1.3 10*3/uL (ref 0.7–4.0)
MCHC: 33.1 g/dL (ref 30.0–36.0)
MCV: 85.1 fl (ref 78.0–100.0)
Monocytes Absolute: 0.4 10*3/uL (ref 0.1–1.0)
Monocytes Relative: 7.7 % (ref 3.0–12.0)
Neutro Abs: 3 10*3/uL (ref 1.4–7.7)
Neutrophils Relative %: 63.2 % (ref 43.0–77.0)
Platelets: 176 10*3/uL (ref 150.0–400.0)
RBC: 4.5 Mil/uL (ref 3.87–5.11)
RDW: 16.7 % — ABNORMAL HIGH (ref 11.5–15.5)
WBC: 4.8 10*3/uL (ref 4.0–10.5)

## 2020-06-05 LAB — IBC + FERRITIN
Ferritin: 7.2 ng/mL — ABNORMAL LOW (ref 10.0–291.0)
Iron: 32 ug/dL — ABNORMAL LOW (ref 42–145)
Saturation Ratios: 6.4 % — ABNORMAL LOW (ref 20.0–50.0)
Transferrin: 359 mg/dL (ref 212.0–360.0)

## 2020-06-05 LAB — LIPID PANEL
Cholesterol: 225 mg/dL — ABNORMAL HIGH (ref 0–200)
HDL: 71.2 mg/dL (ref >39.00)
LDL Cholesterol: 140 mg/dL — ABNORMAL HIGH (ref 0–99)
NonHDL: 153.41
Total CHOL/HDL Ratio: 3
Triglycerides: 67 mg/dL (ref 0.0–149.0)
VLDL: 13.4 mg/dL (ref 0.0–40.0)

## 2020-06-05 LAB — TSH: TSH: 1.52 u[IU]/mL (ref 0.35–4.50)

## 2020-06-05 NOTE — Progress Notes (Signed)
Patient ID: Michelle Roach, female   DOB: [birth year], 59 y.o.   MRN: [id number]   Subjective:    Patient ID: Michelle Roach, female    DOB: 10-15-1961, 59 y.o.   MRN: [id number]  HPI This visit occurred during the SARS-CoV-2 public health emergency.  Safety protocols were in place, including screening questions prior to the visit, additional usage of staff PPE, and extensive cleaning of exam room while observing appropriate contact time as indicated for disinfecting solutions.  Patient here for her physical exam.  She reports she is doing well.  Starting [employer]. Tries to stay active.  No chest pain or sob reported.  No abdominal pain or bowel change reported.  Is iron deficient.  Discussed the need for colonoscopy.  Does report some left knee discomfort - notices when squatting and goes to get up.  No significant pain walking.  Received covid vaccination.    Past Medical History:  Diagnosis Date  . History of colon polyps   . 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 TONSILLECTOMY  1966  . TUBAL LIGATION  1994   Family History  Problem Relation Age of Onset  . Hypertension Mother   . Diabetes Mother   . Cancer Father        colon  . Diabetes Maternal Grandmother   . Breast cancer Neg Hx    Social History   Socioeconomic History  . Marital status: Married    Spouse name: Not on file  . Number of children: Not on file  . Years of education: Not on file  . Highest education level: Not on file  Occupational History  . Not on file  Tobacco Use  . Smoking status: Never Smoker  . Smokeless tobacco: Never Used  Substance and Sexual Activity  . Alcohol use: Yes    Alcohol/week: 0.0 standard drinks    Comment: rarely  . Drug use: No  . Sexual activity: Not on file  Other Topics Concern  . Not on file  Social History Narrative  . Not on file   Social Determinants of Health   Financial Resource Strain:   . Difficulty of  Paying Living Expenses:   Food Insecurity:   . Worried About Charity fundraiser in the Last Year:   . Arboriculturist in the Last Year:   Transportation Needs:   . Film/video editor (Medical):   Marland Kitchen Lack of Transportation (Non-Medical):   Physical Activity:   . Days of Exercise per Week:   . Minutes of Exercise per Session:   Stress:   . Feeling of Stress :   Social Connections:   . Frequency of Communication with Friends and Family:   . Frequency of Social Gatherings with Friends and Family:   . Attends Religious Services:   . Active Member of Clubs or Organizations:   . Attends Archivist Meetings:   Marland Kitchen Marital Status:     Outpatient Encounter Medications as of 06/05/2020  Medication Sig  . aspirin EC 81 MG tablet Take 81 mg by mouth daily. Swallow whole.  . levothyroxine (EUTHYROX) 50 MCG tablet TAKE 1 TABLET BY MOUTH ONCE DAILY BEFORE BREAKFAST  . Multiple Minerals-Vitamins (CALCIUM-MAGNESIUM-ZINC-D3 PO) Take 1 tablet by mouth daily.  . Multiple Vitamin (MULTIVITAMIN) tablet Take 1 tablet by mouth daily.  . Omega-3 Fatty Acids (FISH OIL) 1000 MG CAPS Take by mouth.  . Vitamin D3 (  VITAMIN D) 25 MCG tablet Take 1,000 Units by mouth daily.  . [DISCONTINUED] Red Yeast Rice Extract (RED YEAST RICE PO) Take by mouth.  . [DISCONTINUED] triamcinolone cream (KENALOG) 0.1 % Apply 1 application topically 2 (two) times daily.   No facility-administered encounter medications on file as of 06/05/2020.    Review of Systems  Constitutional: Negative for appetite change and unexpected weight change.  HENT: Negative for congestion and sinus pressure.   Eyes: Negative for pain and visual disturbance.  Respiratory: Negative for cough, chest tightness and shortness of breath.   Cardiovascular: Negative for chest pain, palpitations and leg swelling.  Gastrointestinal: Negative for abdominal pain, diarrhea, nausea and vomiting.  Genitourinary: Negative for difficulty urinating and  dysuria.  Musculoskeletal: Negative for joint swelling and myalgias.       Left knee pain as outlined.    Skin: Negative for color change and rash.  Neurological: Negative for dizziness, light-headedness and headaches.  Hematological: Negative for adenopathy. Does not bruise/bleed easily.  Psychiatric/Behavioral: Negative for agitation and dysphoric mood.       Objective:    Physical Exam Vitals reviewed.  Constitutional:      General: She is not in acute distress.    Appearance: Normal appearance. She is well-developed.  HENT:     Head: Normocephalic and atraumatic.     Right Ear: External ear normal.     Left Ear: External ear normal.  Eyes:     General: No scleral icterus.       Right eye: No discharge.        Left eye: No discharge.     Conjunctiva/sclera: Conjunctivae normal.  Neck:     Thyroid: No thyromegaly.  Cardiovascular:     Rate and Rhythm: Normal rate and regular rhythm.  Pulmonary:     Effort: No tachypnea, accessory muscle usage or respiratory distress.     Breath sounds: Normal breath sounds. No decreased breath sounds or wheezing.  Chest:     Breasts:        Right: No inverted nipple, mass, nipple discharge or tenderness (no axillary adenopathy).        Left: No inverted nipple, mass, nipple discharge or tenderness (no axilarry adenopathy).  Abdominal:     General: Bowel sounds are normal.     Palpations: Abdomen is soft.     Tenderness: There is no abdominal tenderness.  Musculoskeletal:        General: No swelling or tenderness.     Cervical back: Neck supple. No tenderness.  Lymphadenopathy:     Cervical: No cervical adenopathy.  Skin:    Findings: No erythema or rash.  Neurological:     Mental Status: She is alert and oriented to person, place, and time.  Psychiatric:        Mood and Affect: Mood normal.        Behavior: Behavior normal.     BP 120/78   Pulse 65   Temp 98 F (36.7 C) (Oral)   Resp 16   Ht 5\' 4"  (1.626 m)   Wt 138 lb  6.4 oz (62.8 kg)   LMP 02/02/2009   SpO2 99%   BMI 23.76 kg/m  Wt Readings from Last 3 Encounters:  06/05/20 138 lb 6.4 oz (62.8 kg)  05/25/19 137 lb (62.1 kg)  05/12/18 128 lb (58.1 kg)     Lab Results  Component Value Date   WBC 4.8 06/05/2020   HGB 12.7 06/05/2020   HCT 38.3 06/05/2020  PLT 176.0 06/05/2020   GLUCOSE 85 06/05/2020   CHOL 225 (H) 06/05/2020   TRIG 67.0 06/05/2020   HDL 71.20 06/05/2020   LDLDIRECT 147.0 06/08/2013   LDLCALC 140 (H) 06/05/2020   ALT 13 06/05/2020   AST 20 06/05/2020   NA 138 06/05/2020   K 4.1 06/05/2020   CL 102 06/05/2020   CREATININE 0.94 06/05/2020   BUN 19 06/05/2020   CO2 29 06/05/2020   TSH 1.52 06/05/2020   INR 1.0 08/10/2013    MM 3D SCREEN BREAST BILATERAL  Result Date: 12/26/2018 CLINICAL DATA:  Screening. EXAM: DIGITAL SCREENING BILATERAL MAMMOGRAM WITH TOMO AND CAD COMPARISON:  Previous exam(s). ACR Breast Density Category b: There are scattered areas of fibroglandular density. FINDINGS: There are no findings suspicious for malignancy. Images were processed with CAD. IMPRESSION: No mammographic evidence of malignancy. A result letter of this screening mammogram will be mailed directly to the patient. RECOMMENDATION: Screening mammogram in one year. (Code:SM-B-01Y) BI-RADS CATEGORY  1: Negative. Electronically Signed   By: Fidela Salisbury M.D.   On: 12/26/2018 16:13       Assessment & Plan:   Problem List Items Addressed This Visit    Anemia    Check cbc.       Relevant Orders   CBC with Differential/Platelet (Completed)   IBC + Ferritin (Completed)   Ambulatory referral to Gastroenterology   Health care maintenance    Physical today 06/05/20.  PAP 04/2018 - negative with negative HPV.  Mammogram 12/26/18 - [facility] I.  Number to [place] given.  Refer to GI for colonoscopy.        History of colonic polyps    Overdue f/u colonoscopy.  Discussed with her today.  Refer to GI.       History of melanoma  in situ    Dermatology.        Hypercholesterolemia    Low cholesterol diet and exercise.  Follow lipid panel.       Relevant Medications   aspirin EC 81 MG tablet   Other Relevant Orders   Comprehensive metabolic panel (Completed)   Lipid panel (Completed)   Hypothyroidism    On thyroid replacement.  Follow tsh.        Relevant Orders   TSH (Completed)    Other Visit Diagnoses    Routine general medical examination at a health care facility    -  Primary   Colon cancer screening       Relevant Orders   Ambulatory referral to Gastroenterology       [name], MD

## 2020-06-05 NOTE — Assessment & Plan Note (Addendum)
Physical today 06/05/20.  PAP 04/2018 - negative with negative HPV.  Mammogram 12/26/18 - [facility] I.  Number to [place] given.  Refer to GI for colonoscopy.

## 2020-06-06 ENCOUNTER — Other Ambulatory Visit: Payer: Self-pay | Admitting: Internal Medicine

## 2020-06-06 DIAGNOSIS — E611 Iron deficiency: Secondary | ICD-10-CM

## 2020-06-06 NOTE — Progress Notes (Signed)
Order placed for f/u labs.  

## 2020-06-10 ENCOUNTER — Telehealth: Payer: Self-pay | Admitting: Internal Medicine

## 2020-06-10 DIAGNOSIS — Z1231 Encounter for screening mammogram for malignant neoplasm of breast: Secondary | ICD-10-CM

## 2020-06-10 NOTE — Telephone Encounter (Signed)
Pt needs an order for her mammogram at [facility] and a referral so she can get colonoscopy.

## 2020-06-11 NOTE — Telephone Encounter (Signed)
Order placed for GI referral.   

## 2020-06-11 NOTE — Telephone Encounter (Signed)
Placed order for mammogram- Gave her the number for the [facility]CP program at [place]. I think referral for colonoscopy was discussed with her at her appt earlier in the week.

## 2020-06-11 NOTE — Telephone Encounter (Signed)
Pt aware.

## 2020-06-13 ENCOUNTER — Telehealth: Payer: Self-pay | Admitting: Gastroenterology

## 2020-06-13 ENCOUNTER — Encounter: Payer: Self-pay | Admitting: Gastroenterology

## 2020-06-13 NOTE — Telephone Encounter (Signed)
Ok to schedule direct colonoscopy for surveillance for history of adenomatous polyps (SSA removed in 2016). Thanks

## 2020-06-13 NOTE — Telephone Encounter (Signed)
Hey Dr Silverio Decamp, this pt is being referred by [facility] for Colon cancer screening, pt had a colonoscopy done on 10/14/2015, the notes are in epic for review.

## 2020-06-15 NOTE — Assessment & Plan Note (Signed)
Low cholesterol diet and exercise.  Follow lipid panel.   

## 2020-06-15 NOTE — Assessment & Plan Note (Signed)
On thyroid replacement.  Follow tsh.  

## 2020-06-15 NOTE — Assessment & Plan Note (Signed)
Check cbc 

## 2020-06-15 NOTE — Assessment & Plan Note (Signed)
Dermatology

## 2020-06-15 NOTE — Assessment & Plan Note (Signed)
Overdue f/u colonoscopy.  Discussed with her today.  Refer to GI.

## 2020-07-02 ENCOUNTER — Ambulatory Visit
Admission: RE | Admit: 2020-07-02 | Discharge: 2020-07-02 | Disposition: A | Discharge status: Home / Self Care | Payer: Self-pay | Source: Ambulatory Visit | Attending: Internal Medicine | Admitting: Internal Medicine

## 2020-07-02 ENCOUNTER — Other Ambulatory Visit: Payer: Self-pay

## 2020-07-02 DIAGNOSIS — Z1231 Encounter for screening mammogram for malignant neoplasm of breast: Secondary | ICD-10-CM | POA: Insufficient documentation

## 2020-07-22 ENCOUNTER — Telehealth: Payer: Self-pay | Admitting: Internal Medicine

## 2020-07-22 NOTE — Telephone Encounter (Signed)
Patient has a question about the cream that was prescribed from Dr. Nicki Reaper. Patient does not know the name. She would like to know if she can use it for the sores she has on her legs.

## 2020-07-23 ENCOUNTER — Telehealth: Payer: Self-pay | Admitting: *Deleted

## 2020-07-23 NOTE — Telephone Encounter (Signed)
Patient has some triamcinolone cream and she thinks she has some poison oak. She tried the cream and it did not help so she is using calamine lotion and will call back if does not resolve.

## 2020-07-23 NOTE — Telephone Encounter (Signed)
Missed pre-visit appt. Rescheduled for Friday 07/25/20

## 2020-07-25 ENCOUNTER — Ambulatory Visit (AMBULATORY_SURGERY_CENTER): Payer: Self-pay

## 2020-07-25 DIAGNOSIS — Z800 Family history of malignant neoplasm of digestive organs: Secondary | ICD-10-CM

## 2020-07-25 DIAGNOSIS — Z86010 Personal history of colonic polyps: Secondary | ICD-10-CM

## 2020-07-25 MED ORDER — SUTAB 1479-225-188 MG PO TABS: 12.0000 | ORAL_TABLET | ORAL | 0 refills | Status: DC

## 2020-07-25 NOTE — Progress Notes (Signed)
No egg or soy allergy known to patient  No issues with past sedation with any surgeries or procedures no intubation problems in the past  No FH of Malignant Hyperthermia No diet pills per patient No home 02 use per patient  No blood thinners per patient  Pt denies issues with constipation  No A fib or A flutter  EMMI video to pt or via [place] 19 guidelines implemented in PV today with Pt and RN    VIRTUAL PREVISIT   PT DOES NOT HAVE INSURANCE, PT TO PICK UP SAMPLE OF SUTAB  AND HER INSTRUCTIONS ON Monday 07/28/20 BETWEEN [phone]. ON THE 3RD FLOOR.   Due to the COVID-19 pandemic we are asking patients to follow these guidelines. Please only bring one care partner. Please be aware that your care partner may wait in the car in the parking lot or if they feel like they will be too hot to wait in the car, they may wait in the lobby on the 4th floor. All care partners are required to wear a mask the entire time (we do not have any that we can provide them), they need to practice social distancing, and we will do a Covid check for all patient's and care partners when you arrive. Also we will check their temperature and your temperature. If the care partner waits in their car they need to stay in the parking lot the entire time and we will call them on their cell phone when the patient is ready for discharge so they can bring the car to the front of the building. Also all patient's will need to wear a mask into building.

## 2020-08-07 ENCOUNTER — Other Ambulatory Visit: Payer: Self-pay

## 2020-08-07 ENCOUNTER — Other Ambulatory Visit (INDEPENDENT_AMBULATORY_CARE_PROVIDER_SITE_OTHER): Payer: Self-pay

## 2020-08-07 DIAGNOSIS — E611 Iron deficiency: Secondary | ICD-10-CM

## 2020-08-08 LAB — CBC WITH DIFFERENTIAL/PLATELET
Basophils Absolute: 0 10*3/uL (ref 0.0–0.1)
Basophils Relative: 0.7 % (ref 0.0–3.0)
Eosinophils Absolute: 0.1 10*3/uL (ref 0.0–0.7)
Eosinophils Relative: 1.1 % (ref 0.0–5.0)
HCT: 37 % (ref 36.0–46.0)
Hemoglobin: 12.4 g/dL (ref 12.0–15.0)
Lymphocytes Relative: 25.5 % (ref 12.0–46.0)
Lymphs Abs: 1.5 10*3/uL (ref 0.7–4.0)
MCHC: 33.5 g/dL (ref 30.0–36.0)
MCV: 88.6 fl (ref 78.0–100.0)
Monocytes Absolute: 0.4 10*3/uL (ref 0.1–1.0)
Monocytes Relative: 6.9 % (ref 3.0–12.0)
Neutro Abs: 3.9 10*3/uL (ref 1.4–7.7)
Neutrophils Relative %: 65.8 % (ref 43.0–77.0)
Platelets: 185 10*3/uL (ref 150.0–400.0)
RBC: 4.17 Mil/uL (ref 3.87–5.11)
RDW: 17.3 % — ABNORMAL HIGH (ref 11.5–15.5)
WBC: 5.9 10*3/uL (ref 4.0–10.5)

## 2020-08-08 LAB — IBC + FERRITIN
Ferritin: 18.4 ng/mL (ref 10.0–291.0)
Iron: 66 ug/dL (ref 42–145)
Saturation Ratios: 19.5 % — ABNORMAL LOW (ref 20.0–50.0)
Transferrin: 242 mg/dL (ref 212.0–360.0)

## 2020-08-12 ENCOUNTER — Telehealth: Payer: Self-pay | Admitting: Internal Medicine

## 2020-08-12 ENCOUNTER — Telehealth: Payer: Self-pay | Admitting: Gastroenterology

## 2020-08-12 DIAGNOSIS — R42 Dizziness and giddiness: Secondary | ICD-10-CM

## 2020-08-12 NOTE — Telephone Encounter (Signed)
Order placed for referral to [facility]

## 2020-08-12 NOTE — Telephone Encounter (Signed)
I do agree with postponing the colonoscopy if having dizziness - until can get this resolved.  (then can reschedule).  Has she ever had inner ear?  If persistent dizziness, does need to be evaluated.  Can refer to ENT - for evaluation of inner ear - can do ENT testing to confirm diagnosis.

## 2020-08-12 NOTE — Telephone Encounter (Signed)
Pt would like to be referred to ENT. She is going to ues meclizine prn and call in a couple days with update. She has never had inner ear but dizziness is with change of position and when moving her head. Keeps saying she feels "Swimmy headed" but is ok if she is laying still. Confirmed no other acute symptoms.

## 2020-08-12 NOTE — Telephone Encounter (Signed)
Pt complaining of dizziness w/ change of position and when she turns her head starting yesterday. Thought it was related to her iron being low. Went over labs and advised that levels have improved so should not be related to this. She has a colonoscopy scheduled on Thursday but is going to cancel if she does not feel better. Confirmed no other acute symptoms except for the dizziness. Advised that she does need to be evaluated since she is having acute onset dizziness, patient requested that I send message over to you first.

## 2020-08-12 NOTE — Telephone Encounter (Signed)
Pt called to get lab results also wanted to know if Dr.Scott wanted her to keep her colonscopy on Thursday because of her iron levels and still feeling dizzy at times

## 2020-08-12 NOTE — Telephone Encounter (Signed)
Spoke with the patient. She is having vertigo and cannot get out of the bed. PCP advised she postpone her procedure. She would like to reschedule the screening colonoscopy. Okay to reschedule. She will call back with her schedule for late October. Dr Silverio Decamp is her primary GI.

## 2020-08-12 NOTE — Telephone Encounter (Signed)
Pt is requesting a call back from a nurse to discuss her dizziness and the reason why she had to cancel her 9/30 procedure.

## 2020-08-12 NOTE — Addendum Note (Signed)
Addended by: Alisa Graff on: 08/12/2020 09:54 PM   Modules accepted: Orders

## 2020-08-13 ENCOUNTER — Telehealth: Payer: Self-pay | Admitting: Internal Medicine

## 2020-08-13 NOTE — Telephone Encounter (Signed)
Patient stated she is feeling better. She rescheduled her colonoscopy for next month. She will still see ENT but they have not called her yet.

## 2020-08-13 NOTE — Telephone Encounter (Signed)
Patient called in wanted to discuss her dizzness

## 2020-08-14 ENCOUNTER — Encounter: Payer: Self-pay | Admitting: Gastroenterology

## 2020-08-21 ENCOUNTER — Other Ambulatory Visit: Payer: Self-pay

## 2020-08-21 ENCOUNTER — Ambulatory Visit (AMBULATORY_SURGERY_CENTER): Payer: Self-pay | Admitting: Gastroenterology

## 2020-08-21 ENCOUNTER — Encounter: Payer: Self-pay | Admitting: Gastroenterology

## 2020-08-21 VITALS — BP 122/76 | HR 58 | Temp 97.3°F | Resp 11 | Ht 64.0 in | Wt 127.0 lb

## 2020-08-21 DIAGNOSIS — Z800 Family history of malignant neoplasm of digestive organs: Secondary | ICD-10-CM

## 2020-08-21 DIAGNOSIS — Z86010 Personal history of colonic polyps: Secondary | ICD-10-CM

## 2020-08-21 MED ORDER — SODIUM CHLORIDE 0.9 % IV SOLN: 500.0000 mL | Freq: Once | INTRAVENOUS | Status: DC

## 2020-08-21 NOTE — Patient Instructions (Signed)
YOU HAD AN ENDOSCOPIC PROCEDURE TODAY AT [facility]:   Refer to the procedure report that was given to you for any specific questions about what was found during the examination.  If the procedure report does not answer your questions, please call your gastroenterologist to clarify.  If you requested that your care partner not be given the details of your procedure findings, then the procedure report has been included in a sealed envelope for you to review at your convenience later.  YOU SHOULD EXPECT: Some feelings of bloating in the abdomen. Passage of more gas than usual.  Walking can help get rid of the air that was put into your GI tract during the procedure and reduce the bloating. If you had a lower endoscopy (such as a colonoscopy or flexible sigmoidoscopy) you may notice spotting of blood in your stool or on the toilet paper. If you underwent a bowel prep for your procedure, you may not have a normal bowel movement for a few days.  Please Note:  You might notice some irritation and congestion in your nose or some drainage.  This is from the oxygen used during your procedure.  There is no need for concern and it should clear up in a day or so.  SYMPTOMS TO REPORT IMMEDIATELY:   Following lower endoscopy (colonoscopy or flexible sigmoidoscopy):  Excessive amounts of blood in the stool  Significant tenderness or worsening of abdominal pains  Swelling of the abdomen that is new, acute  Fever of 100F or higher  For urgent or emergent issues, a gastroenterologist can be reached at any hour by calling [phone]. Do not use MyChart messaging for urgent concerns.    DIET:  We do recommend a small meal at first, but then you may proceed to your regular diet.  Drink plenty of fluids but you should avoid alcoholic beverages for 24 hours.  ACTIVITY:  You should plan to take it easy for the rest of today and you should NOT DRIVE or use heavy machinery until tomorrow (because  of the sedation medicines used during the test).    FOLLOW UP: Our staff will call the number listed on your records 48-72 hours following your procedure to check on you and address any questions or concerns that you may have regarding the information given to you following your procedure. If we do not reach you, we will leave a message.  We will attempt to reach you two times.  During this call, we will ask if you have developed any symptoms of COVID 19. If you develop any symptoms (ie: fever, flu-like symptoms, shortness of breath, cough etc.) before then, please call [phone].  If you test positive for Covid 19 in the 2 weeks post procedure, please call and report this information to [place].   SIGNATURES/CONFIDENTIALITY: You and/or your care partner have signed paperwork which will be entered into your electronic medical record.  These signatures attest to the fact that that the information above on your After Visit Summary has been reviewed and is understood.  Full responsibility of the confidentiality of this discharge information lies with you and/or your care-partner.  Next colonoscopy in 5 years  Continue your normal medications  Please read over handouts about hemorrhoids, diverticulosis, and high fiber diets  Please call Dr. Woodward Ku office to set up endoscopy at your convenience

## 2020-08-21 NOTE — Progress Notes (Signed)
VS- Randall Hiss RN  Pt has a hx of PONV  Pt had episode of vertigo last week, 08-12-20.  This cleared up the next day and no incidences since  Pt is aware that Dr. Silverio Decamp recommends an EGD- pt states she will call back to schedule d/t lack of insurance

## 2020-08-21 NOTE — Progress Notes (Signed)
A/ox3, pleased with MAC, report to RN 

## 2020-08-21 NOTE — Op Note (Signed)
[facility] Patient Name: Michelle Roach Procedure Date: 08/21/2020 11:45 AM MRN: [id number] Endoscopist: Mauri Pole , MD Age: 59 Referring MD:  Date of Birth: August 01, 1961 Gender: Female Account #: [id number] Procedure:                Colonoscopy Indications:              Screening for malignant neoplasm in the colon,                            Screening in patient at increased risk: Family                            history of 1st-degree relative with colorectal                            cancer, High risk colon cancer surveillance:                            Personal history of colonic polyps Medicines:                Monitored Anesthesia Care Procedure:                Pre-Anesthesia Assessment:                           - Prior to the procedure, a History and Physical                            was performed, and patient medications and                            allergies were reviewed. The patient's tolerance of                            previous anesthesia was also reviewed. The risks                            and benefits of the procedure and the sedation                            options and risks were discussed with the patient.                            All questions were answered, and informed consent                            was obtained. Prior Anticoagulants: The patient has                            taken no previous anticoagulant or antiplatelet                            agents. ASA Grade Assessment: II - A patient with  mild systemic disease. After reviewing the risks                            and benefits, the patient was deemed in                            satisfactory condition to undergo the procedure.                           After obtaining informed consent, the colonoscope                            was passed under direct vision. Throughout the                            procedure, the patient's blood  pressure, pulse, and                            oxygen saturations were monitored continuously. The                            Colonoscope was introduced through the anus and                            advanced to the the cecum, identified by                            appendiceal orifice and ileocecal valve. The                            colonoscopy was performed without difficulty. The                            patient tolerated the procedure well. The quality                            of the bowel preparation was adequate. The                            ileocecal valve, appendiceal orifice, and rectum                            were photographed. Scope In: 11:54:05 AM Scope Out: 12:16:00 PM Scope Withdrawal Time: 0 hours 9 minutes 12 seconds  Total Procedure Duration: 0 hours 21 minutes 55 seconds  Findings:                 The perianal and digital rectal examinations were                            normal.                           A few small-mouthed diverticula were found in the  sigmoid colon.                           Non-bleeding internal hemorrhoids were found during                            retroflexion. The hemorrhoids were small. Complications:            No immediate complications. Estimated Blood Loss:     Estimated blood loss was minimal. Impression:               - Diverticulosis in the sigmoid colon.                           - Non-bleeding internal hemorrhoids.                           - No specimens collected. Recommendation:           - Patient has a contact number available for                            emergencies. The signs and symptoms of potential                            delayed complications were discussed with the                            patient. Return to normal activities tomorrow.                            Written discharge instructions were provided to the                            patient.                            - Resume previous diet.                           - Continue present medications.                           - Repeat colonoscopy in 5 years for screening                            purposes.                           - Schedule EGD next available appointment to                            evaluate iron deficiency and may need to consider                            small bowel video capsule for further evaluation if  EGD negative for any potential source of bleeding Mauri Pole, MD 08/21/2020 12:36:26 PM This report has been signed electronically.

## 2020-08-25 ENCOUNTER — Telehealth: Payer: Self-pay

## 2020-08-25 ENCOUNTER — Telehealth: Payer: Self-pay | Admitting: *Deleted

## 2020-08-25 NOTE — Telephone Encounter (Signed)
  Follow up Call-  Call back number 08/21/2020  Post procedure Call Back phone  # [phone]  Permission to leave phone message Yes  Some recent data might be hidden     Patient questions:  Do you have a fever, pain , or abdominal swelling? No. Pain Score  0 *  Have you tolerated food without any problems? Yes.    Have you been able to return to your normal activities? Yes.    Do you have any questions about your discharge instructions: Diet   No. Medications  No. Follow up visit  No.  Do you have questions or concerns about your Care? No.  Actions: * If pain score is 4 or above: No action needed, pain <4.  1. Have you developed a fever since your procedure? no  2.   Have you had an respiratory symptoms (SOB or cough) since your procedure? no  3.   Have you tested positive for COVID 19 since your procedure no  4.   Have you had any family members/close contacts diagnosed with the COVID 19 since your procedure?  no   If yes to any of these questions please route to Joylene John, RN and Joella Prince, RN

## 2020-08-25 NOTE — Telephone Encounter (Signed)
LVM

## 2020-09-03 ENCOUNTER — Telehealth: Payer: Self-pay | Admitting: Internal Medicine

## 2020-09-03 NOTE — Telephone Encounter (Signed)
Rejection Reason - Patient Declined - Patient declined referral appointment-[facility] " [place] said on Sep 03, 2020 2:15 PM

## 2020-09-16 ENCOUNTER — Telehealth: Payer: Self-pay | Admitting: Gastroenterology

## 2020-09-16 NOTE — Telephone Encounter (Signed)
Pt is requesting a call back from a nurse to discuss which anesthesiologist did her anesthesia for her procedure on 08/21/2020.

## 2020-09-16 NOTE — Telephone Encounter (Signed)
She needs to let our anesthesia group know that she does not have insurance. She received an EOB from her former health insurance company.

## 2020-09-17 NOTE — Telephone Encounter (Signed)
Sending this to you. The patient is very concerned that you did not know she is uninsured. She did not know how to reach your company. Billing did not know how to help her. It was sent to me.

## 2020-09-18 ENCOUNTER — Telehealth: Payer: Self-pay

## 2020-09-18 DIAGNOSIS — E611 Iron deficiency: Secondary | ICD-10-CM

## 2020-09-18 NOTE — Telephone Encounter (Signed)
Pt would like to get her iron checked again. Please call to schedule after orders are put in

## 2020-09-19 NOTE — Telephone Encounter (Signed)
Patient would like to have her iron checked again because she stopped taking her iron supplements.

## 2020-09-20 NOTE — Telephone Encounter (Signed)
Orders placed for f/u cbc and iron studies. Please schedule non fasting lab appt.  See her phone message.  Thanks.

## 2020-09-20 NOTE — Addendum Note (Signed)
Addended by: Alisa Graff on: 09/20/2020 12:07 PM   Modules accepted: Orders

## 2020-09-22 NOTE — Telephone Encounter (Signed)
Called and scheduled pt

## 2020-09-26 ENCOUNTER — Other Ambulatory Visit: Payer: Self-pay

## 2020-09-26 ENCOUNTER — Other Ambulatory Visit (INDEPENDENT_AMBULATORY_CARE_PROVIDER_SITE_OTHER): Payer: Self-pay

## 2020-09-26 DIAGNOSIS — E611 Iron deficiency: Secondary | ICD-10-CM

## 2020-09-26 LAB — CBC WITH DIFFERENTIAL/PLATELET
Basophils Absolute: 0 10*3/uL (ref 0.0–0.1)
Basophils Relative: 0.5 % (ref 0.0–3.0)
Eosinophils Absolute: 0 10*3/uL (ref 0.0–0.7)
Eosinophils Relative: 0.7 % (ref 0.0–5.0)
HCT: 41.4 % (ref 36.0–46.0)
Hemoglobin: 13.9 g/dL (ref 12.0–15.0)
Lymphocytes Relative: 30.3 % (ref 12.0–46.0)
Lymphs Abs: 1.6 10*3/uL (ref 0.7–4.0)
MCHC: 33.7 g/dL (ref 30.0–36.0)
MCV: 88.8 fl (ref 78.0–100.0)
Monocytes Absolute: 0.4 10*3/uL (ref 0.1–1.0)
Monocytes Relative: 7.5 % (ref 3.0–12.0)
Neutro Abs: 3.2 10*3/uL (ref 1.4–7.7)
Neutrophils Relative %: 61 % (ref 43.0–77.0)
Platelets: 155 10*3/uL (ref 150.0–400.0)
RBC: 4.66 Mil/uL (ref 3.87–5.11)
RDW: 15.5 % (ref 11.5–15.5)
WBC: 5.2 10*3/uL (ref 4.0–10.5)

## 2020-09-26 LAB — IBC + FERRITIN
Ferritin: 22.9 ng/mL (ref 10.0–291.0)
Iron: 103 ug/dL (ref 42–145)
Saturation Ratios: 26.3 % (ref 20.0–50.0)
Transferrin: 280 mg/dL (ref 212.0–360.0)

## 2021-03-02 ENCOUNTER — Other Ambulatory Visit: Payer: Self-pay | Admitting: Internal Medicine

## 2021-03-02 NOTE — Telephone Encounter (Signed)
Patient called about the status of refill. She has 3 pills remaining.

## 2021-06-03 ENCOUNTER — Telehealth: Payer: Self-pay | Admitting: *Deleted

## 2021-06-03 DIAGNOSIS — E039 Hypothyroidism, unspecified: Secondary | ICD-10-CM

## 2021-06-03 DIAGNOSIS — D649 Anemia, unspecified: Secondary | ICD-10-CM

## 2021-06-03 DIAGNOSIS — E7800 Pure hypercholesterolemia, unspecified: Secondary | ICD-10-CM

## 2021-06-03 NOTE — Telephone Encounter (Signed)
Please place future orders for lab appt.  

## 2021-06-03 NOTE — Telephone Encounter (Signed)
Orders placed for labs

## 2021-06-04 ENCOUNTER — Other Ambulatory Visit: Payer: Self-pay

## 2021-06-05 ENCOUNTER — Other Ambulatory Visit: Payer: Self-pay

## 2021-06-05 ENCOUNTER — Telehealth: Payer: Self-pay

## 2021-06-05 ENCOUNTER — Other Ambulatory Visit (INDEPENDENT_AMBULATORY_CARE_PROVIDER_SITE_OTHER): Payer: Self-pay

## 2021-06-05 DIAGNOSIS — E7800 Pure hypercholesterolemia, unspecified: Secondary | ICD-10-CM

## 2021-06-05 DIAGNOSIS — D649 Anemia, unspecified: Secondary | ICD-10-CM

## 2021-06-05 DIAGNOSIS — E039 Hypothyroidism, unspecified: Secondary | ICD-10-CM

## 2021-06-05 LAB — CBC WITH DIFFERENTIAL/PLATELET
Basophils Absolute: 0 10*3/uL (ref 0.0–0.1)
Basophils Relative: 0.7 % (ref 0.0–3.0)
Eosinophils Absolute: 0.1 10*3/uL (ref 0.0–0.7)
Eosinophils Relative: 1.7 % (ref 0.0–5.0)
HCT: 40.6 % (ref 36.0–46.0)
Hemoglobin: 13.3 g/dL (ref 12.0–15.0)
Lymphocytes Relative: 35.4 % (ref 12.0–46.0)
Lymphs Abs: 1.5 10*3/uL (ref 0.7–4.0)
MCHC: 32.7 g/dL (ref 30.0–36.0)
MCV: 90.2 fl (ref 78.0–100.0)
Monocytes Absolute: 0.3 10*3/uL (ref 0.1–1.0)
Monocytes Relative: 7.5 % (ref 3.0–12.0)
Neutro Abs: 2.3 10*3/uL (ref 1.4–7.7)
Neutrophils Relative %: 54.7 % (ref 43.0–77.0)
Platelets: 155 10*3/uL (ref 150.0–400.0)
RBC: 4.5 Mil/uL (ref 3.87–5.11)
RDW: 15.5 % (ref 11.5–15.5)
WBC: 4.2 10*3/uL (ref 4.0–10.5)

## 2021-06-05 LAB — LIPID PANEL
Cholesterol: 234 mg/dL — ABNORMAL HIGH (ref 0–200)
HDL: 78.8 mg/dL (ref >39.00)
LDL Cholesterol: 140 mg/dL — ABNORMAL HIGH (ref 0–99)
NonHDL: 154.97
Total CHOL/HDL Ratio: 3
Triglycerides: 75 mg/dL (ref 0.0–149.0)
VLDL: 15 mg/dL (ref 0.0–40.0)

## 2021-06-05 LAB — COMPREHENSIVE METABOLIC PANEL
ALT: 12 U/L (ref 0–35)
AST: 18 U/L (ref 0–37)
Albumin: 4.3 g/dL (ref 3.5–5.2)
Alkaline Phosphatase: 47 U/L (ref 39–117)
BUN: 18 mg/dL (ref 6–23)
CO2: 28 mEq/L (ref 19–32)
Calcium: 9.3 mg/dL (ref 8.4–10.5)
Chloride: 104 mEq/L (ref 96–112)
Creatinine, Ser: 1.01 mg/dL (ref 0.40–1.20)
GFR: 59.86 mL/min — ABNORMAL LOW (ref >60.00)
Glucose, Bld: 86 mg/dL (ref 70–99)
Potassium: 4.4 mEq/L (ref 3.5–5.1)
Sodium: 140 mEq/L (ref 135–145)
Total Bilirubin: 0.5 mg/dL (ref 0.2–1.2)
Total Protein: 6.6 g/dL (ref 6.0–8.3)

## 2021-06-05 LAB — FERRITIN: Ferritin: 10.4 ng/mL (ref 10.0–291.0)

## 2021-06-05 LAB — TSH: TSH: 3.11 u[IU]/mL (ref 0.35–5.50)

## 2021-06-05 MED ORDER — LEVOTHYROXINE SODIUM 50 MCG PO TABS: 50.0000 ug | ORAL_TABLET | Freq: Every day | ORAL | 0 refills | Status: DC

## 2021-06-05 NOTE — Telephone Encounter (Signed)
Synthroid medication has been refilled.

## 2021-06-05 NOTE — Telephone Encounter (Signed)
Pt needs refill on levothyroxine (SYNTHROID) 50 MCG tablet before CPE on Tuesday. Please send today. Pt only has one left. Pharmacy is [facility] on [address] for today

## 2021-06-09 ENCOUNTER — Other Ambulatory Visit (HOSPITAL_COMMUNITY)
Admission: RE | Admit: 2021-06-09 | Discharge: 2021-06-09 | Disposition: A | Discharge status: Home / Self Care | Payer: Self-pay | Source: Ambulatory Visit | Attending: Internal Medicine | Admitting: Internal Medicine

## 2021-06-09 ENCOUNTER — Other Ambulatory Visit: Payer: Self-pay

## 2021-06-09 ENCOUNTER — Ambulatory Visit (INDEPENDENT_AMBULATORY_CARE_PROVIDER_SITE_OTHER): Payer: Self-pay | Admitting: Internal Medicine

## 2021-06-09 VITALS — BP 108/70 | HR 63 | Temp 98.0°F | Resp 16 | Ht 64.0 in | Wt 128.8 lb

## 2021-06-09 DIAGNOSIS — D649 Anemia, unspecified: Secondary | ICD-10-CM

## 2021-06-09 DIAGNOSIS — Z124 Encounter for screening for malignant neoplasm of cervix: Secondary | ICD-10-CM | POA: Insufficient documentation

## 2021-06-09 DIAGNOSIS — Z0000 Encounter for general adult medical examination without abnormal findings: Secondary | ICD-10-CM

## 2021-06-09 DIAGNOSIS — Z86006 Personal history of melanoma in-situ: Secondary | ICD-10-CM

## 2021-06-09 DIAGNOSIS — E7800 Pure hypercholesterolemia, unspecified: Secondary | ICD-10-CM

## 2021-06-09 DIAGNOSIS — Z1231 Encounter for screening mammogram for malignant neoplasm of breast: Secondary | ICD-10-CM

## 2021-06-09 DIAGNOSIS — Z86010 Personal history of colonic polyps: Secondary | ICD-10-CM

## 2021-06-09 DIAGNOSIS — E039 Hypothyroidism, unspecified: Secondary | ICD-10-CM

## 2021-06-09 MED ORDER — ESTRADIOL 0.1 MG/GM VA CREA: TOPICAL_CREAM | VAGINAL | 3 refills | Status: DC

## 2021-06-09 NOTE — Progress Notes (Signed)
Patient ID: Michelle Roach, female   DOB: 1961/01/08, 60 y.o.   MRN: [id number]   Subjective:    Patient ID: Michelle Roach, female    DOB: Nov 02, 1961, 60 y.o.   MRN: [id number]  HPI This visit occurred during the SARS-CoV-2 public health emergency.  Safety protocols were in place, including screening questions prior to the visit, additional usage of staff PPE, and extensive cleaning of exam room while observing appropriate contact time as indicated for disinfecting solutions.   Patient here for her physical exam. She reports she is doing well.  Feels good.  Staying active.  No chest pain or sob reported.  No abdominal pain or bowel change reported.  No cough or congestion.    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   Thyroid disease    Vertigo    saw MD for this 08-12-20- cleared up the next day   Past Surgical History:  Procedure Laterality Date   COLONOSCOPY  2016   [place]   Family History  Problem Relation Age of Onset   Hypertension Mother    Diabetes Mother    Cancer Father        colon   Colon cancer Father    Esophageal cancer Father    Diabetes Maternal Grandmother    Breast cancer Neg Hx    Rectal cancer Neg Hx    Stomach cancer Neg Hx    Social History   Socioeconomic History   Marital status: Married    Spouse name: Not on file   Number of children: Not on file   Years of education: Not on file   Highest education level: Not on file  Occupational History   Not on file  Tobacco Use   Smoking status: Never   Smokeless tobacco: Never  Vaping Use   Vaping Use: Never used  Substance and Sexual Activity   Alcohol use: Yes    Alcohol/week: 0.0 standard drinks    Comment: rarely   Drug use: No   Sexual activity: Not on file  Other Topics Concern   Not on file  Social History Narrative   Not on file   Social Determinants of Health    Financial Resource Strain: Not on file  Food Insecurity: Not on file  Transportation Needs: Not on file  Physical Activity: Not on file  Stress: Not on file  Social Connections: Not on file    Review of Systems  Constitutional:  Negative for appetite change and unexpected weight change.  HENT:  Negative for congestion, sinus pressure and sore throat.   Eyes:  Negative for pain and visual disturbance.  Respiratory:  Negative for cough, chest tightness and shortness of breath.   Cardiovascular:  Negative for chest pain, palpitations and leg swelling.  Gastrointestinal:  Negative for abdominal pain, diarrhea, nausea and vomiting.  Genitourinary:  Negative for difficulty urinating and dysuria.  Musculoskeletal:  Negative for joint swelling and myalgias.  Skin:  Negative for color change and rash.  Neurological:  Negative for dizziness, light-headedness and headaches.  Hematological:  Negative for adenopathy. Does not bruise/bleed easily.  Psychiatric/Behavioral:  Negative for agitation and dysphoric mood.       Objective:    Physical Exam Vitals reviewed.  Constitutional:      General: She is not in acute distress.  Appearance: Normal appearance. She is well-developed.  HENT:     Head: Normocephalic and atraumatic.     Right Ear: External ear normal.     Left Ear: External ear normal.  Eyes:     General: No scleral icterus.       Right eye: No discharge.        Left eye: No discharge.     Conjunctiva/sclera: Conjunctivae normal.  Neck:     Thyroid: No thyromegaly.  Cardiovascular:     Rate and Rhythm: Normal rate and regular rhythm.  Pulmonary:     Effort: No tachypnea, accessory muscle usage or respiratory distress.     Breath sounds: Normal breath sounds. No decreased breath sounds or wheezing.  Chest:  Breasts:    Right: No inverted nipple, mass, nipple discharge or tenderness (no axillary adenopathy).     Left: No inverted nipple, mass, nipple discharge or  tenderness (no axilarry adenopathy).  Abdominal:     General: Bowel sounds are normal.     Palpations: Abdomen is soft.     Tenderness: There is no abdominal tenderness.  Genitourinary:    Comments: Normal external genitalia.  Vaginal vault without lesions.  Cervix identified.  Pap smear performed.  Could not appreciate any adnexal masses or tenderness.   Musculoskeletal:        General: No swelling or tenderness.     Cervical back: Neck supple.  Lymphadenopathy:     Cervical: No cervical adenopathy.  Skin:    Findings: No erythema or rash.  Neurological:     Mental Status: She is alert and oriented to person, place, and time.  Psychiatric:        Mood and Affect: Mood normal.        Behavior: Behavior normal.    BP 108/70   Pulse 63   Temp 98 F (36.7 C)   Resp 16   Ht '5\' 4"'$  (1.626 m)   Wt 128 lb 12.8 oz (58.4 kg)   LMP 02/02/2009   SpO2 98%   BMI 22.11 kg/m  Wt Readings from Last 3 Encounters:  06/09/21 128 lb 12.8 oz (58.4 kg)  08/21/20 127 lb (57.6 kg)  06/05/20 138 lb 6.4 oz (62.8 kg)    Outpatient Encounter Medications as of 06/09/2021  Medication Sig   estradiol (ESTRACE VAGINAL) 0.1 MG/GM vaginal cream Apply vaginally q day x 5 days and then 2x/week.   Ascorbic Acid (VITAMIN C) 500 MG CHEW Chew 1 tablet by mouth daily.   aspirin EC 81 MG tablet Take 81 mg by mouth daily. Swallow whole.   ferrous sulfate 325 (65 FE) MG tablet Take by mouth.   levothyroxine (SYNTHROID) 50 MCG tablet Take 1 tablet (50 mcg total) by mouth daily before breakfast.   Multiple Minerals-Vitamins (CALCIUM-MAGNESIUM-ZINC-D3 PO) Take 1 tablet by mouth daily.   Multiple Vitamin (MULTIVITAMIN) tablet Take 1 tablet by mouth daily.   Omega-3 Fatty Acids (FISH OIL) 1000 MG CAPS Take by mouth.   TURMERIC CURCUMIN PO Take 2 capsules by mouth daily.   Vitamin D3 (VITAMIN D) 25 MCG tablet Take 1,000 Units by mouth daily.   zinc gluconate 50 MG tablet Take 50 mg by mouth daily.   No  facility-administered encounter medications on file as of 06/09/2021.     Lab Results  Component Value Date   WBC 4.2 06/05/2021   HGB 13.3 06/05/2021   HCT 40.6 06/05/2021   PLT 155.0 06/05/2021   GLUCOSE 86 06/05/2021   CHOL  234 (H) 06/05/2021   TRIG 75.0 06/05/2021   HDL 78.80 06/05/2021   LDLDIRECT 147.0 06/08/2013   LDLCALC 140 (H) 06/05/2021   ALT 12 06/05/2021   AST 18 06/05/2021   NA 140 06/05/2021   K 4.4 06/05/2021   CL 104 06/05/2021   CREATININE 1.01 06/05/2021   BUN 18 06/05/2021   CO2 28 06/05/2021   TSH 3.11 06/05/2021   INR 1.0 08/10/2013    MM 3D SCREEN BREAST BILATERAL  Result Date: 07/03/2020 CLINICAL DATA:  Screening. EXAM: DIGITAL SCREENING BILATERAL MAMMOGRAM WITH TOMO AND CAD COMPARISON:  Previous exam(s). ACR Breast Density Category b: There are scattered areas of fibroglandular density. FINDINGS: There are no findings suspicious for malignancy. Images were processed with CAD. IMPRESSION: No mammographic evidence of malignancy. A result letter of this screening mammogram will be mailed directly to the patient. RECOMMENDATION: Screening mammogram in one year. (Code:SM-B-01Y) BI-RADS CATEGORY  1: Negative. Electronically Signed   By: Curlene Dolphin M.D.   On: 07/03/2020 14:35       Assessment & Plan:   Problem List Items Addressed This Visit     Anemia    Follow cbc.        Health care maintenance    Physical today 06/09/21.  PAP 06/09/21.  Mammogram 07/03/20 - Birads I.  She will schedule f/u mammogram.  Colonoscopy 08/21/20 - diverticulosis and internal hemorrhoids.         History of colonic polyps    Colonoscopy 08/21/20 - diverticulosis and internal hemorrhoids        History of melanoma in situ    Dermatology.         Hypercholesterolemia    The 10-year ASCVD risk score Mikey Bussing DC Brooke Bonito., et al., 2013) is: 2.7%   Values used to calculate the score:     Age: 38 years     Sex: Female     Is Non-Hispanic African American: No     Diabetic:  No     Tobacco smoker: No     Systolic Blood Pressure: 123XX123 mmHg     Is BP treated: No     HDL Cholesterol: 78.8 mg/dL     Total Cholesterol: 234 mg/dL  Low cholesterol diet and exercise.  Follow lipid panel.        Hypothyroidism    On thyroid replacement.  Follow tsh.         Other Visit Diagnoses     Routine general medical examination at a health care facility    -  Primary   Cervical cancer screening       Relevant Orders   Cytology - PAP( [place]) (Completed)   Encounter for screening mammogram for malignant neoplasm of breast       Relevant Orders   MM 3D SCREEN BREAST BILATERAL        [name], MD

## 2021-06-09 NOTE — Assessment & Plan Note (Addendum)
The 10-year ASCVD risk score Mikey Bussing DC Brooke Bonito., et al., 2013) is: 2.7%   Values used to calculate the score:     Age: 60 years     Sex: Female     Is Non-Hispanic African American: No     Diabetic: No     Tobacco smoker: No     Systolic Blood Pressure: 123XX123 mmHg     Is BP treated: No     HDL Cholesterol: 78.8 mg/dL     Total Cholesterol: 234 mg/dL  Low cholesterol diet and exercise.  Follow lipid panel.

## 2021-06-10 LAB — CYTOLOGY - PAP
Comment: NEGATIVE
Diagnosis: NEGATIVE
High risk HPV: NEGATIVE

## 2021-06-14 ENCOUNTER — Encounter: Payer: Self-pay | Admitting: Internal Medicine

## 2021-06-14 NOTE — Assessment & Plan Note (Signed)
On thyroid replacement.  Follow tsh.  

## 2021-06-14 NOTE — Assessment & Plan Note (Signed)
Colonoscopy 08/21/20 - diverticulosis and internal hemorrhoids  

## 2021-06-14 NOTE — Assessment & Plan Note (Signed)
Follow cbc.  

## 2021-06-14 NOTE — Assessment & Plan Note (Signed)
Dermatology

## 2021-06-14 NOTE — Assessment & Plan Note (Addendum)
Physical today 06/09/21.  PAP 06/09/21.  Mammogram 07/03/20 - Birads I.  She will schedule f/u mammogram.  Colonoscopy 08/21/20 - diverticulosis and internal hemorrhoids.

## 2021-09-02 ENCOUNTER — Other Ambulatory Visit: Payer: Self-pay | Admitting: Internal Medicine

## 2021-09-30 ENCOUNTER — Ambulatory Visit: Payer: Self-pay

## 2021-09-30 ENCOUNTER — Other Ambulatory Visit: Payer: Self-pay

## 2021-11-03 ENCOUNTER — Ambulatory Visit: Payer: Self-pay

## 2021-11-27 ENCOUNTER — Other Ambulatory Visit: Payer: Self-pay | Admitting: Internal Medicine

## 2021-12-07 ENCOUNTER — Ambulatory Visit
Admission: RE | Admit: 2021-12-07 | Discharge: 2021-12-07 | Disposition: A | Discharge status: Home / Self Care | Payer: No Typology Code available for payment source | Source: Ambulatory Visit | Attending: Internal Medicine | Admitting: Internal Medicine

## 2021-12-07 DIAGNOSIS — Z1231 Encounter for screening mammogram for malignant neoplasm of breast: Secondary | ICD-10-CM

## 2022-05-26 ENCOUNTER — Other Ambulatory Visit: Payer: Self-pay | Admitting: Internal Medicine

## 2022-06-03 ENCOUNTER — Telehealth: Payer: Self-pay | Admitting: *Deleted

## 2022-06-03 DIAGNOSIS — D649 Anemia, unspecified: Secondary | ICD-10-CM

## 2022-06-03 DIAGNOSIS — E039 Hypothyroidism, unspecified: Secondary | ICD-10-CM

## 2022-06-03 DIAGNOSIS — E7800 Pure hypercholesterolemia, unspecified: Secondary | ICD-10-CM

## 2022-06-03 NOTE — Telephone Encounter (Signed)
Please place future orders for lab appt.  

## 2022-06-03 NOTE — Telephone Encounter (Signed)
Orders placed for labs

## 2022-06-10 ENCOUNTER — Other Ambulatory Visit (INDEPENDENT_AMBULATORY_CARE_PROVIDER_SITE_OTHER): Payer: Self-pay

## 2022-06-10 DIAGNOSIS — D649 Anemia, unspecified: Secondary | ICD-10-CM

## 2022-06-10 DIAGNOSIS — E7800 Pure hypercholesterolemia, unspecified: Secondary | ICD-10-CM

## 2022-06-10 DIAGNOSIS — E039 Hypothyroidism, unspecified: Secondary | ICD-10-CM

## 2022-06-10 LAB — COMPREHENSIVE METABOLIC PANEL
ALT: 14 U/L (ref 0–35)
AST: 19 U/L (ref 0–37)
Albumin: 4.5 g/dL (ref 3.5–5.2)
Alkaline Phosphatase: 59 U/L (ref 39–117)
BUN: 20 mg/dL (ref 6–23)
CO2: 28 mEq/L (ref 19–32)
Calcium: 9.4 mg/dL (ref 8.4–10.5)
Chloride: 103 mEq/L (ref 96–112)
Creatinine, Ser: 0.97 mg/dL (ref 0.40–1.20)
GFR: 62.39 mL/min (ref >60.00)
Glucose, Bld: 88 mg/dL (ref 70–99)
Potassium: 4.5 mEq/L (ref 3.5–5.1)
Sodium: 139 mEq/L (ref 135–145)
Total Bilirubin: 0.6 mg/dL (ref 0.2–1.2)
Total Protein: 7.1 g/dL (ref 6.0–8.3)

## 2022-06-10 LAB — CBC WITH DIFFERENTIAL/PLATELET
Basophils Absolute: 0 10*3/uL (ref 0.0–0.1)
Basophils Relative: 0.5 % (ref 0.0–3.0)
Eosinophils Absolute: 0.1 10*3/uL (ref 0.0–0.7)
Eosinophils Relative: 2 % (ref 0.0–5.0)
HCT: 39.7 % (ref 36.0–46.0)
Hemoglobin: 13.2 g/dL (ref 12.0–15.0)
Lymphocytes Relative: 36.6 % (ref 12.0–46.0)
Lymphs Abs: 1.7 10*3/uL (ref 0.7–4.0)
MCHC: 33.2 g/dL (ref 30.0–36.0)
MCV: 89.9 fl (ref 78.0–100.0)
Monocytes Absolute: 0.4 10*3/uL (ref 0.1–1.0)
Monocytes Relative: 7.8 % (ref 3.0–12.0)
Neutro Abs: 2.5 10*3/uL (ref 1.4–7.7)
Neutrophils Relative %: 53.1 % (ref 43.0–77.0)
Platelets: 175 10*3/uL (ref 150.0–400.0)
RBC: 4.42 Mil/uL (ref 3.87–5.11)
RDW: 15 % (ref 11.5–15.5)
WBC: 4.7 10*3/uL (ref 4.0–10.5)

## 2022-06-10 LAB — FERRITIN: Ferritin: 15.1 ng/mL (ref 10.0–291.0)

## 2022-06-10 LAB — LIPID PANEL
Cholesterol: 238 mg/dL — ABNORMAL HIGH (ref 0–200)
HDL: 79.2 mg/dL (ref >39.00)
LDL Cholesterol: 137 mg/dL — ABNORMAL HIGH (ref 0–99)
NonHDL: 158.8
Total CHOL/HDL Ratio: 3
Triglycerides: 110 mg/dL (ref 0.0–149.0)
VLDL: 22 mg/dL (ref 0.0–40.0)

## 2022-06-10 LAB — TSH: TSH: 3.25 u[IU]/mL (ref 0.35–5.50)

## 2022-06-11 ENCOUNTER — Ambulatory Visit (INDEPENDENT_AMBULATORY_CARE_PROVIDER_SITE_OTHER): Payer: Self-pay | Admitting: Internal Medicine

## 2022-06-11 ENCOUNTER — Encounter: Payer: Self-pay | Admitting: Internal Medicine

## 2022-06-11 VITALS — BP 106/74 | HR 65 | Temp 98.0°F | Resp 15 | Ht 64.0 in | Wt 127.4 lb

## 2022-06-11 DIAGNOSIS — D649 Anemia, unspecified: Secondary | ICD-10-CM

## 2022-06-11 DIAGNOSIS — Z0000 Encounter for general adult medical examination without abnormal findings: Secondary | ICD-10-CM

## 2022-06-11 DIAGNOSIS — E7800 Pure hypercholesterolemia, unspecified: Secondary | ICD-10-CM

## 2022-06-11 DIAGNOSIS — E039 Hypothyroidism, unspecified: Secondary | ICD-10-CM

## 2022-06-11 DIAGNOSIS — Z86010 Personal history of colonic polyps: Secondary | ICD-10-CM

## 2022-06-11 NOTE — Assessment & Plan Note (Signed)
The 10-year ASCVD risk score (Arnett DK, et al., 2019) is: 2.9%   Values used to calculate the score:     Age: 61 years     Sex: Female     Is Non-Hispanic African American: No     Diabetic: No     Tobacco smoker: No     Systolic Blood Pressure: 922 mmHg     Is BP treated: No     HDL Cholesterol: 79.2 mg/dL     Total Cholesterol: 238 mg/dL

## 2022-06-11 NOTE — Progress Notes (Unsigned)
Patient ID: Michelle Roach, female   DOB: 04-Nov-1961, 61 y.o.   MRN: [id number]   Subjective:    Patient ID: Michelle Roach, female    DOB: 1961/02/28, 61 y.o.   MRN: [id number]  This visit occurred during the SARS-CoV-2 public health emergency.  Safety protocols were in place, including screening questions prior to the visit, additional usage of staff PPE, and extensive cleaning of exam room while observing appropriate contact time as indicated for disinfecting solutions.   Patient here for  Chief Complaint  Patient presents with   Annual Exam   .   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   Thyroid disease    Vertigo    saw MD for this 08-12-20- cleared up the next day   Past Surgical History:  Procedure Laterality Date   COLONOSCOPY  2016   [place]   Family History  Problem Relation Age of Onset   Hypertension Mother    Diabetes Mother    Cancer Father        colon   Colon cancer Father    Esophageal cancer Father    Diabetes Maternal Grandmother    Breast cancer Neg Hx    Rectal cancer Neg Hx    Stomach cancer Neg Hx    Social History   Socioeconomic History   Marital status: Married    Spouse name: Not on file   Number of children: Not on file   Years of education: Not on file   Highest education level: Not on file  Occupational History   Not on file  Tobacco Use   Smoking status: Never   Smokeless tobacco: Never  Vaping Use   Vaping Use: Never used  Substance and Sexual Activity   Alcohol use: Yes    Alcohol/week: 0.0 standard drinks of alcohol    Comment: rarely   Drug use: No   Sexual activity: Not on file  Other Topics Concern   Not on file  Social History Narrative   Not on file   Social Determinants of Health   Financial Resource Strain: Not on file  Food Insecurity: Not on file  Transportation Needs: Not on file   Physical Activity: Not on file  Stress: Not on file  Social Connections: Not on file     Review of Systems     Objective:     BP 106/74 (BP Location: Left Arm, Patient Position: Sitting, Cuff Size: Small)   Pulse 65   Temp 98 F (36.7 C) (Temporal)   Resp 15   Ht '5\' 4"'$  (1.626 m)   Wt 127 lb 6.4 oz (57.8 kg)   LMP 02/02/2009   SpO2 98%   BMI 21.87 kg/m  Wt Readings from Last 3 Encounters:  06/11/22 127 lb 6.4 oz (57.8 kg)  06/09/21 128 lb 12.8 oz (58.4 kg)  08/21/20 127 lb (57.6 kg)    Physical Exam   Outpatient Encounter Medications as of 06/11/2022  Medication Sig   aspirin EC 81 MG tablet Take 81 mg by mouth daily. Swallow whole.   b complex vitamins capsule Take 1 capsule by mouth daily.   Berberine Chloride (BERBERINE HCI) 500 MG CAPS Take by mouth.   estradiol (ESTRACE VAGINAL) 0.1 MG/GM vaginal cream Apply vaginally q day x 5 days and then  2x/week.   levothyroxine (SYNTHROID) 50 MCG tablet TAKE 1 TABLET BY MOUTH ONCE DAILY BEFORE BREAKFAST   MAGNESIUM GLYCINATE PO Take by mouth.   Multiple Vitamin (MULTIVITAMIN) tablet Take 1 tablet by mouth daily.   Omega-3 Fatty Acids (FISH OIL) 1000 MG CAPS Take by mouth.   Vitamin D3 (VITAMIN D) 25 MCG tablet Take 1,000 Units by mouth daily.   [DISCONTINUED] Ascorbic Acid (VITAMIN C) 500 MG CHEW Chew 1 tablet by mouth daily.   [DISCONTINUED] ferrous sulfate 325 (65 FE) MG tablet Take by mouth.   [DISCONTINUED] Multiple Minerals-Vitamins (CALCIUM-MAGNESIUM-ZINC-D3 PO) Take 1 tablet by mouth daily.   [DISCONTINUED] TURMERIC CURCUMIN PO Take 2 capsules by mouth daily.   [DISCONTINUED] zinc gluconate 50 MG tablet Take 50 mg by mouth daily.   No facility-administered encounter medications on file as of 06/11/2022.     Lab Results  Component Value Date   WBC 4.7 06/10/2022   HGB 13.2 06/10/2022   HCT 39.7 06/10/2022   PLT 175.0 06/10/2022   GLUCOSE 88 06/10/2022   CHOL 238 (H) 06/10/2022   TRIG 110.0 06/10/2022   HDL  79.20 06/10/2022   LDLDIRECT 147.0 06/08/2013   LDLCALC 137 (H) 06/10/2022   ALT 14 06/10/2022   AST 19 06/10/2022   NA 139 06/10/2022   K 4.5 06/10/2022   CL 103 06/10/2022   CREATININE 0.97 06/10/2022   BUN 20 06/10/2022   CO2 28 06/10/2022   TSH 3.25 06/10/2022   INR 1.0 08/10/2013    MM 3D SCREEN BREAST BILATERAL  Result Date: 12/07/2021 CLINICAL DATA:  Screening. EXAM: DIGITAL SCREENING BILATERAL MAMMOGRAM WITH TOMOSYNTHESIS AND CAD TECHNIQUE: Bilateral screening digital craniocaudal and mediolateral oblique mammograms were obtained. Bilateral screening digital breast tomosynthesis was performed. The images were evaluated with computer-aided detection. COMPARISON:  Previous exam(s). ACR Breast Density Category b: There are scattered areas of fibroglandular density. FINDINGS: There are no findings suspicious for malignancy. IMPRESSION: No mammographic evidence of malignancy. A result letter of this screening mammogram will be mailed directly to the patient. RECOMMENDATION: Screening mammogram in one year. (Code:SM-B-01Y) BI-RADS CATEGORY  1: Negative. Electronically Signed   By: Valentino Saxon M.D.   On: 12/07/2021 14:33       Assessment & Plan:   Problem List Items Addressed This Visit   None    [name], MD

## 2022-06-11 NOTE — Patient Instructions (Signed)
Ferrous sulfate '325mg'$ 

## 2022-06-12 ENCOUNTER — Encounter: Payer: Self-pay | Admitting: Internal Medicine

## 2022-06-12 NOTE — Assessment & Plan Note (Signed)
Physical today.  Mammogram 12/07/21 - birads I.  Colonoscopy 08/2020 - diverticulosis and internal hemorrhoids.  PAP 06/09/21 - negative with negative HPV.

## 2022-06-12 NOTE — Assessment & Plan Note (Signed)
On thyroid replacement.  Follow tsh.  

## 2022-06-12 NOTE — Assessment & Plan Note (Signed)
Follow cbc.  

## 2022-06-13 NOTE — Assessment & Plan Note (Signed)
Colonoscopy 08/21/20 - diverticulosis and internal hemorrhoids

## 2022-08-30 ENCOUNTER — Other Ambulatory Visit: Payer: Self-pay | Admitting: Internal Medicine

## 2022-09-01 ENCOUNTER — Telehealth: Payer: Self-pay | Admitting: Internal Medicine

## 2022-09-01 NOTE — Telephone Encounter (Signed)
The patient is completley out of her medication would like it filled today.

## 2022-09-01 NOTE — Telephone Encounter (Signed)
Told pt this was sent in on yesterday she stated she was completely out but received a VM from pharmacy of it being no refills. Pt is aware a 3 month supply was sent on yesterday and she stated she will call her pharmacy

## 2022-11-21 ENCOUNTER — Other Ambulatory Visit: Payer: Self-pay | Admitting: Internal Medicine

## 2023-02-23 ENCOUNTER — Other Ambulatory Visit: Payer: Self-pay | Admitting: Internal Medicine

## 2023-04-05 ENCOUNTER — Other Ambulatory Visit: Payer: Self-pay | Admitting: Internal Medicine

## 2023-04-05 DIAGNOSIS — Z1231 Encounter for screening mammogram for malignant neoplasm of breast: Secondary | ICD-10-CM

## 2023-04-27 ENCOUNTER — Ambulatory Visit
Admission: RE | Admit: 2023-04-27 | Discharge: 2023-04-27 | Disposition: A | Discharge status: Home / Self Care | Payer: No Typology Code available for payment source | Source: Ambulatory Visit | Attending: Internal Medicine | Admitting: Internal Medicine

## 2023-04-27 DIAGNOSIS — Z1231 Encounter for screening mammogram for malignant neoplasm of breast: Secondary | ICD-10-CM

## 2023-05-31 ENCOUNTER — Telehealth: Payer: Self-pay | Admitting: Internal Medicine

## 2023-05-31 DIAGNOSIS — D649 Anemia, unspecified: Secondary | ICD-10-CM

## 2023-05-31 DIAGNOSIS — E039 Hypothyroidism, unspecified: Secondary | ICD-10-CM

## 2023-05-31 DIAGNOSIS — E7800 Pure hypercholesterolemia, unspecified: Secondary | ICD-10-CM

## 2023-05-31 NOTE — Telephone Encounter (Signed)
Patient need lab orders.

## 2023-06-01 NOTE — Telephone Encounter (Signed)
 Orders placed for labs

## 2023-06-06 ENCOUNTER — Other Ambulatory Visit (INDEPENDENT_AMBULATORY_CARE_PROVIDER_SITE_OTHER): Payer: Self-pay

## 2023-06-06 ENCOUNTER — Other Ambulatory Visit: Payer: Self-pay | Admitting: Internal Medicine

## 2023-06-06 DIAGNOSIS — D649 Anemia, unspecified: Secondary | ICD-10-CM

## 2023-06-06 DIAGNOSIS — E7800 Pure hypercholesterolemia, unspecified: Secondary | ICD-10-CM

## 2023-06-06 DIAGNOSIS — E039 Hypothyroidism, unspecified: Secondary | ICD-10-CM

## 2023-06-06 LAB — CBC WITH DIFFERENTIAL/PLATELET
Basophils Absolute: 0 10*3/uL (ref 0.0–0.1)
Basophils Relative: 0.8 % (ref 0.0–3.0)
Eosinophils Absolute: 0.1 10*3/uL (ref 0.0–0.7)
Eosinophils Relative: 1.8 % (ref 0.0–5.0)
HCT: 36.5 % (ref 36.0–46.0)
Hemoglobin: 11.6 g/dL — ABNORMAL LOW (ref 12.0–15.0)
Lymphocytes Relative: 35.4 % (ref 12.0–46.0)
Lymphs Abs: 2 10*3/uL (ref 0.7–4.0)
MCHC: 31.7 g/dL (ref 30.0–36.0)
MCV: 79.2 fl (ref 78.0–100.0)
Monocytes Absolute: 0.5 10*3/uL (ref 0.1–1.0)
Monocytes Relative: 8.8 % (ref 3.0–12.0)
Neutro Abs: 3 10*3/uL (ref 1.4–7.7)
Neutrophils Relative %: 53.2 % (ref 43.0–77.0)
Platelets: 190 10*3/uL (ref 150.0–400.0)
RBC: 4.61 Mil/uL (ref 3.87–5.11)
RDW: 15.7 % — ABNORMAL HIGH (ref 11.5–15.5)
WBC: 5.6 10*3/uL (ref 4.0–10.5)

## 2023-06-06 LAB — LIPID PANEL
Cholesterol: 227 mg/dL — ABNORMAL HIGH (ref 0–200)
HDL: 77.7 mg/dL (ref >39.00)
LDL Cholesterol: 139 mg/dL — ABNORMAL HIGH (ref 0–99)
NonHDL: 149.45
Total CHOL/HDL Ratio: 3
Triglycerides: 52 mg/dL (ref 0.0–149.0)
VLDL: 10.4 mg/dL (ref 0.0–40.0)

## 2023-06-06 LAB — IBC + FERRITIN
Ferritin: 5.3 ng/mL — ABNORMAL LOW (ref 10.0–291.0)
Iron: 27 ug/dL — ABNORMAL LOW (ref 42–145)
Saturation Ratios: 5.4 % — ABNORMAL LOW (ref 20.0–50.0)
TIBC: 499.8 ug/dL — ABNORMAL HIGH (ref 250.0–450.0)
Transferrin: 357 mg/dL (ref 212.0–360.0)

## 2023-06-06 LAB — HEPATIC FUNCTION PANEL
ALT: 10 U/L (ref 0–35)
AST: 17 U/L (ref 0–37)
Albumin: 4.2 g/dL (ref 3.5–5.2)
Alkaline Phosphatase: 63 U/L (ref 39–117)
Bilirubin, Direct: 0.1 mg/dL (ref 0.0–0.3)
Total Bilirubin: 0.3 mg/dL (ref 0.2–1.2)
Total Protein: 6.9 g/dL (ref 6.0–8.3)

## 2023-06-06 LAB — BASIC METABOLIC PANEL
BUN: 17 mg/dL (ref 6–23)
CO2: 25 mEq/L (ref 19–32)
Calcium: 9.1 mg/dL (ref 8.4–10.5)
Chloride: 107 mEq/L (ref 96–112)
Creatinine, Ser: 0.96 mg/dL (ref 0.40–1.20)
GFR: 62.73 mL/min (ref >60.00)
Glucose, Bld: 89 mg/dL (ref 70–99)
Potassium: 4.1 mEq/L (ref 3.5–5.1)
Sodium: 140 mEq/L (ref 135–145)

## 2023-06-06 LAB — TSH: TSH: 3.33 u[IU]/mL (ref 0.35–5.50)

## 2023-06-06 LAB — VITAMIN B12: Vitamin B-12: 1009 pg/mL — ABNORMAL HIGH (ref 211–911)

## 2023-06-06 NOTE — Progress Notes (Signed)
Order placed for add on labs.   °

## 2023-06-08 ENCOUNTER — Ambulatory Visit (INDEPENDENT_AMBULATORY_CARE_PROVIDER_SITE_OTHER): Payer: Self-pay | Admitting: Internal Medicine

## 2023-06-08 VITALS — BP 130/76 | HR 70 | Temp 98.0°F | Resp 16 | Ht 64.0 in | Wt 132.2 lb

## 2023-06-08 DIAGNOSIS — E039 Hypothyroidism, unspecified: Secondary | ICD-10-CM

## 2023-06-08 DIAGNOSIS — D649 Anemia, unspecified: Secondary | ICD-10-CM

## 2023-06-08 DIAGNOSIS — Z0000 Encounter for general adult medical examination without abnormal findings: Secondary | ICD-10-CM

## 2023-06-08 DIAGNOSIS — E7800 Pure hypercholesterolemia, unspecified: Secondary | ICD-10-CM

## 2023-06-08 DIAGNOSIS — Z136 Encounter for screening for cardiovascular disorders: Secondary | ICD-10-CM

## 2023-06-08 NOTE — Assessment & Plan Note (Signed)
Physical today 06/08/23.  Mammogram 04/27/23 - birads I.  Colonoscopy 08/2020 - diverticulosis and internal hemorrhoids.  PAP 06/09/21 - negative with negative HPV.

## 2023-06-08 NOTE — Assessment & Plan Note (Addendum)
Recent cbc with slightly decreased hgb.  B12 wnl.  Iron low. Hold on giving blood.  Multivitamin with iron q day.  Recheck cbc and ferritin.

## 2023-06-08 NOTE — Patient Instructions (Signed)
Ferrous sulfate '325mg'$  - take one per day ?

## 2023-06-08 NOTE — Assessment & Plan Note (Addendum)
The 10-year ASCVD risk score (Arnett DK, et al., 2019) is: 4.7%   Values used to calculate the score:     Age: 62 years     Sex: Female     Is Non-Hispanic African American: No     Diabetic: No     Tobacco smoker: No     Systolic Blood Pressure: 130 mmHg     Is BP treated: No     HDL Cholesterol: 77.7 mg/dL     Total Cholesterol: 227 mg/dL  Low cholesterol diet and exercise.  Questions regarding cardiac risk.  Schedule for calcium score.

## 2023-06-08 NOTE — Progress Notes (Signed)
Subjective:    Patient ID: Michelle Roach, female    DOB: [birth year], 62 y.o.   MRN: [id number]  Patient here for  Chief Complaint  Patient presents with   Annual Exam    HPI Here for a physical exam.  Reports she is doing relatively well.  Tries to stay active.  No chest pain or sob reported.  No increased cough or congestion.  No abdominal pain or bowel change reported.  Discussed recent labs.  Discussed decreased hgb and low iron.  She does give blood q 8 weeks.  Colonoscopy 2021.  No acid reflux.  No blood in stool.  Does report numbness/tingling - bilateral hands.  Discussed further w/up - NCS.  Will notify me if desires. Discussed calcium score - further testing/screening for CAD.    Past Medical History:  Diagnosis Date   Cancer [facility])    History of colon polyps    Melanoma in situ (HCC)    right leg   Thyroid disease    Vertigo    saw MD for this 08-12-20- cleared up the next day   Past Surgical History:  Procedure Laterality Date   COLONOSCOPY  2016   TONSILLECTOMY  1966   TUBAL LIGATION  1994   Family History  Problem Relation Age of Onset   Hypertension Mother    Diabetes Mother    Cancer Father        colon   Colon cancer Father    Esophageal cancer Father    Diabetes Maternal Grandmother    Breast cancer Neg Hx    Rectal cancer Neg Hx    Stomach cancer Neg Hx    Social History   Socioeconomic History   Marital status: Married    Spouse name: Not on file   Number of children: Not on file   Years of education: Not on file   Highest education level: Not on file  Occupational History   Not on file  Tobacco Use   Smoking status: Never   Smokeless tobacco: Never  Vaping Use   Vaping status: Never Used  Substance and Sexual Activity   Alcohol use: Yes    Alcohol/week: 0.0 standard drinks of alcohol    Comment: rarely   Drug use: No   Sexual activity: Not on file  Other Topics Concern   Not on file  Social History Narrative   Not on file    Social Determinants of Health   Financial Resource Strain: Not on file  Food Insecurity: Not on file  Transportation Needs: Not on file  Physical Activity: Not on file  Stress: Not on file  Social Connections: Not on file     Review of Systems  Constitutional:  Negative for appetite change and unexpected weight change.  HENT:  Negative for congestion, sinus pressure and sore throat.   Eyes:  Negative for pain and visual disturbance.  Respiratory:  Negative for cough, chest tightness and shortness of breath.   Cardiovascular:  Negative for chest pain, palpitations and leg swelling.  Gastrointestinal:  Negative for abdominal pain, diarrhea, nausea and vomiting.  Genitourinary:  Negative for difficulty urinating and dysuria.  Musculoskeletal:  Negative for joint swelling and myalgias.  Skin:  Negative for color change and rash.  Neurological:  Negative for dizziness and headaches.  Hematological:  Negative for adenopathy. Does not bruise/bleed easily.  Psychiatric/Behavioral:  Negative for agitation and dysphoric mood.        Objective:     BP 130/76  Pulse 70   Temp 98 F (36.7 C)   Resp 16   Ht 5\' 4"  (1.626 m)   Wt 132 lb 3.2 oz (60 kg)   LMP 02/02/2009   SpO2 99%   BMI 22.69 kg/m  Wt Readings from Last 3 Encounters:  06/08/23 132 lb 3.2 oz (60 kg)  06/11/22 127 lb 6.4 oz (57.8 kg)  06/09/21 128 lb 12.8 oz (58.4 kg)    Physical Exam Vitals reviewed.  Constitutional:      General: She is not in acute distress.    Appearance: Normal appearance. She is well-developed.  HENT:     Head: Normocephalic and atraumatic.     Right Ear: External ear normal.     Left Ear: External ear normal.  Eyes:     General: No scleral icterus.       Right eye: No discharge.        Left eye: No discharge.     Conjunctiva/sclera: Conjunctivae normal.  Neck:     Thyroid: No thyromegaly.  Cardiovascular:     Rate and Rhythm: Normal rate and regular rhythm.  Pulmonary:      Effort: No tachypnea, accessory muscle usage or respiratory distress.     Breath sounds: Normal breath sounds. No decreased breath sounds or wheezing.  Chest:  Breasts:    Right: No inverted nipple, mass, nipple discharge or tenderness (no axillary adenopathy).     Left: No inverted nipple, mass, nipple discharge or tenderness (no axilarry adenopathy).  Abdominal:     General: Bowel sounds are normal.     Palpations: Abdomen is soft.     Tenderness: There is no abdominal tenderness.  Musculoskeletal:        General: No swelling or tenderness.     Cervical back: Neck supple.  Lymphadenopathy:     Cervical: No cervical adenopathy.  Skin:    Findings: No erythema or rash.  Neurological:     Mental Status: She is alert and oriented to person, place, and time.  Psychiatric:        Mood and Affect: Mood normal.        Behavior: Behavior normal.      Outpatient Encounter Medications as of 06/08/2023  Medication Sig   levothyroxine (SYNTHROID) 50 MCG tablet TAKE 1 TABLET BY MOUTH ONCE DAILY BEFORE BREAKFAST   [DISCONTINUED] aspirin EC 81 MG tablet Take 81 mg by mouth daily. Swallow whole.   [DISCONTINUED] b complex vitamins capsule Take 1 capsule by mouth daily.   [DISCONTINUED] Berberine Chloride (BERBERINE HCI) 500 MG CAPS Take by mouth.   [DISCONTINUED] estradiol (ESTRACE VAGINAL) 0.1 MG/GM vaginal cream Apply vaginally q day x 5 days and then 2x/week.   [DISCONTINUED] MAGNESIUM GLYCINATE PO Take by mouth.   [DISCONTINUED] Multiple Vitamin (MULTIVITAMIN) tablet Take 1 tablet by mouth daily.   [DISCONTINUED] Omega-3 Fatty Acids (FISH OIL) 1000 MG CAPS Take by mouth.   [DISCONTINUED] Vitamin D3 (VITAMIN D) 25 MCG tablet Take 1,000 Units by mouth daily.   No facility-administered encounter medications on file as of 06/08/2023.     Lab Results  Component Value Date   WBC 5.6 06/06/2023   HGB 11.6 (L) 06/06/2023   HCT 36.5 06/06/2023   PLT 190.0 06/06/2023   GLUCOSE 89 06/06/2023    CHOL 227 (H) 06/06/2023   TRIG 52.0 06/06/2023   HDL 77.70 06/06/2023   LDLDIRECT 147.0 06/08/2013   LDLCALC 139 (H) 06/06/2023   ALT 10 06/06/2023   AST 17 06/06/2023  NA 140 06/06/2023   K 4.1 06/06/2023   CL 107 06/06/2023   CREATININE 0.96 06/06/2023   BUN 17 06/06/2023   CO2 25 06/06/2023   TSH 3.33 06/06/2023   INR 1.0 08/10/2013    MM 3D SCREENING MAMMOGRAM BILATERAL BREAST  Result Date: 04/29/2023 CLINICAL DATA:  Screening. EXAM: DIGITAL SCREENING BILATERAL MAMMOGRAM WITH TOMOSYNTHESIS AND CAD TECHNIQUE: Bilateral screening digital craniocaudal and mediolateral oblique mammograms were obtained. Bilateral screening digital breast tomosynthesis was performed. The images were evaluated with computer-aided detection. COMPARISON:  Previous exam(s). ACR Breast Density Category b: There are scattered areas of fibroglandular density. FINDINGS: There are no findings suspicious for malignancy. IMPRESSION: No mammographic evidence of malignancy. A result letter of this screening mammogram will be mailed directly to the patient. RECOMMENDATION: Screening mammogram in one year. (Code:SM-B-01Y) BI-RADS CATEGORY  1: Negative. Electronically Signed   By: Emmaline Kluver M.D.   On: 04/29/2023 10:09       Assessment & Plan:  Routine general medical examination at a health care facility  Health care maintenance Assessment & Plan: Physical today 06/08/23.  Mammogram 04/27/23 - birads I.  Colonoscopy 08/2020 - diverticulosis and internal hemorrhoids.  PAP 06/09/21 - negative with negative HPV.    Anemia, unspecified type Assessment & Plan: Recent cbc with slightly decreased hgb.  B12 wnl.  Iron low. Hold on giving blood.  Multivitamin with iron q day.  Recheck cbc and ferritin.   Orders: -     CBC with Differential/Platelet; Future -     Ferritin; Future  Hypercholesterolemia Assessment & Plan: The 10-year ASCVD risk score (Arnett DK, et al., 2019) is: 4.7%   Values used to calculate  the score:     Age: 84 years     Sex: Female     Is Non-Hispanic African American: No     Diabetic: No     Tobacco smoker: No     Systolic Blood Pressure: 130 mmHg     Is BP treated: No     HDL Cholesterol: 77.7 mg/dL     Total Cholesterol: 227 mg/dL  Low cholesterol diet and exercise.  Questions regarding cardiac risk.  Schedule for calcium score.    Encounter for screening for coronary artery disease -     CT CARDIAC SCORING (SELF PAY ONLY); Future  Hypothyroidism, unspecified type Assessment & Plan: On thyroid replacement.  Follow tsh.        Dale Chester, MD

## 2023-06-12 ENCOUNTER — Encounter: Payer: Self-pay | Admitting: Internal Medicine

## 2023-06-12 NOTE — Assessment & Plan Note (Signed)
On thyroid replacement.  Follow tsh.  

## 2023-06-29 ENCOUNTER — Ambulatory Visit
Admission: RE | Admit: 2023-06-29 | Discharge: 2023-06-29 | Disposition: A | Discharge status: Home / Self Care | Payer: Self-pay | Source: Ambulatory Visit | Attending: Internal Medicine | Admitting: Internal Medicine

## 2023-06-29 DIAGNOSIS — Z136 Encounter for screening for cardiovascular disorders: Secondary | ICD-10-CM | POA: Insufficient documentation

## 2023-07-15 ENCOUNTER — Other Ambulatory Visit: Payer: Self-pay

## 2023-07-15 DIAGNOSIS — R911 Solitary pulmonary nodule: Secondary | ICD-10-CM

## 2023-07-28 ENCOUNTER — Encounter: Payer: Self-pay | Admitting: Student in an Organized Health Care Education/Training Program

## 2023-07-28 ENCOUNTER — Ambulatory Visit (INDEPENDENT_AMBULATORY_CARE_PROVIDER_SITE_OTHER): Payer: Self-pay | Admitting: Student in an Organized Health Care Education/Training Program

## 2023-07-28 ENCOUNTER — Encounter: Payer: Self-pay | Admitting: Internal Medicine

## 2023-07-28 VITALS — BP 130/60 | HR 79 | Temp 97.6°F | Ht 64.0 in | Wt 134.4 lb

## 2023-07-28 DIAGNOSIS — R911 Solitary pulmonary nodule: Secondary | ICD-10-CM

## 2023-07-28 NOTE — Progress Notes (Signed)
Synopsis: Referred in for pulmonary nodule by Dale , MD  Assessment & Plan:   1. Pulmonary nodule  Patient is presenting for the evaluation of an incidentally found pulmonary nodule that is 3 mm in size.  She is asymptomatic and has no respiratory symptoms nor has she had any recent infections or any history of lung disease.  Patient has no occupational exposures and is a non-smoker but did report secondhand smoke exposure in her childhood.  She has a history of melanoma that was excised in 1989.  Given patient has a history of melanoma, I will monitor the nodule with interval imaging at the 72-month point.  This would allow [place] to evaluate the rest of the lung parenchyma.  Should there be no change in the size of the nodule we will see his radiographic monitoring.  - CT CHEST WO CONTRAST; Future   Return in about 6 months (around 01/25/2024).  I spent 45 minutes caring for this patient today, including preparing to see the patient, obtaining a medical history , reviewing a separately obtained history, performing a medically appropriate examination and/or evaluation, counseling and educating the patient/family/caregiver, ordering medications, tests, or procedures, documenting clinical information in the electronic health record, and independently interpreting results (not separately reported/billed) and communicating results to the patient/family/caregiver  Raechel Chute, MD [facility] 07/28/2023 11:55 AM    End of visit medications:  No orders of the defined types were placed in this encounter.    Current Outpatient Medications:    Iron, Ferrous Sulfate, 325 (65 Fe) MG TABS, , Disp: , Rfl:    levothyroxine (SYNTHROID) 50 MCG tablet, TAKE 1 TABLET BY MOUTH ONCE DAILY BEFORE BREAKFAST, Disp: 90 tablet, Rfl: 1   MAGNESIUM GLUCONATE PO, Take 2 capsules by mouth daily in the afternoon., Disp: , Rfl:    Subjective:   PATIENT ID: Darius Bump GENDER:  female DOB: 1961/03/21, MRN: [id number]  Chief Complaint  Patient presents with   pulmonary consult    Patient denies any cough, SOB or wheezing.    HPI  The patient is a pleasant 62 year old female presenting to clinic for the evaluation of an incidentally found pulmonary nodule.  Patient is in her usual state of health and has no symptoms.  She has no shortness of breath, no cough, no wheezing, no chest pain, no chest tightness, no fevers, no chills.  She has not had any recent pulmonary infections or any pulmonary infections in the past.  She has not had any sick contacts.  She denies any previous history of pulmonary disease.  Patient was seen by her primary care physician and a CT scan of the coronaries was ordered.  This was performed mid August 2024 and was notable for an incidentally found 3 mm pulmonary nodule.  Patient reports a history of melanoma in 1989 that was excised (right thigh).  She has had [facility] as well as currently by her dermatologist without any issues.  She is maintained on levothyroxine and takes no other medications.  Patient is a non-smoker and has not smoked or vaped in her life.  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  She was exposed to secondhand smoke in her childhood from her father.  She denies any other occupational exposures.  Ancillary information including prior medications, full medical/surgical/family/social histories, and PFTs (when available) are listed below and have been reviewed.   Review of Systems  Constitutional:  Negative for chills, fever and  weight loss.  Respiratory:  Negative for cough, hemoptysis, sputum production, shortness of breath and wheezing.   Cardiovascular:  Negative for chest pain and palpitations.  All other systems reviewed and are negative.    Objective:   Vitals:   07/28/23 1133  BP: 130/60  Pulse: 79  Temp: 97.6 F (36.4 C)  TempSrc: Temporal  SpO2: 100%  Weight:  134 lb 6.4 oz (61 kg)  Height: 5\' 4"  (1.626 m)   100% on RA  BMI Readings from Last 3 Encounters:  07/28/23 23.07 kg/m  06/08/23 22.69 kg/m  06/11/22 21.87 kg/m   Wt Readings from Last 3 Encounters:  07/28/23 134 lb 6.4 oz (61 kg)  06/08/23 132 lb 3.2 oz (60 kg)  06/11/22 127 lb 6.4 oz (57.8 kg)    Physical Exam Constitutional:      Appearance: Normal appearance.  Cardiovascular:     Rate and Rhythm: Normal rate and regular rhythm.     Pulses: Normal pulses.     Heart sounds: Normal heart sounds.  Pulmonary:     Effort: Pulmonary effort is normal.     Breath sounds: Normal breath sounds.  Abdominal:     Palpations: Abdomen is soft.  Musculoskeletal:     Right lower leg: No edema.     Left lower leg: No edema.  Neurological:     General: No focal deficit present.     Mental Status: She is alert and oriented to person, place, and time. Mental status is at baseline.     Ancillary Information    Past Medical History:  Diagnosis Date   Cancer [facility])    History of colon polyps    Melanoma in situ (HCC)    right leg   Thyroid disease    Vertigo    saw MD for this 08-12-20- cleared up the next day     Family History  Problem Relation Age of Onset   Hypertension Mother    Diabetes Mother    Cancer Father        colon   Colon cancer Father    Esophageal cancer Father    Diabetes Maternal Grandmother    Breast cancer Neg Hx    Rectal cancer Neg Hx    Stomach cancer Neg Hx      Past Surgical History:  Procedure Laterality Date   COLONOSCOPY  2016   TONSILLECTOMY  1966   TUBAL LIGATION  1994    Social History   Socioeconomic History   Marital status: Married    Spouse name: Not on file   Number of children: Not on file   Years of education: Not on file   Highest education level: Not on file  Occupational History   Not on file  Tobacco Use   Smoking status: Never   Smokeless tobacco: Never  Vaping Use   Vaping status: Never Used  Substance and  Sexual Activity   Alcohol use: Yes    Alcohol/week: 0.0 standard drinks of alcohol    Comment: rarely   Drug use: No   Sexual activity: Not on file  Other Topics Concern   Not on file  Social History Narrative   Not on file   Social Determinants of Health   Financial Resource Strain: Not on file  Food Insecurity: Not on file  Transportation Needs: Not on file  Physical Activity: Not on file  Stress: Not on file  Social Connections: Not on file  Intimate Partner Violence: Not on file  No Known Allergies   CBC    Component Value Date/Time   WBC 5.6 06/06/2023 0731   RBC 4.61 06/06/2023 0731   HGB 11.6 (L) 06/06/2023 0731   HGB 13.9 08/10/2013 1552   HCT 36.5 06/06/2023 0731   HCT 41.5 08/10/2013 1552   PLT 190.0 06/06/2023 0731   PLT 190 08/10/2013 1552   MCV 79.2 06/06/2023 0731   MCV 89 08/10/2013 1552   MCH 29.1 05/12/2018 1655   MCHC 31.7 06/06/2023 0731   RDW 15.7 (H) 06/06/2023 0731   RDW 14.1 08/10/2013 1552   LYMPHSABS 2.0 06/06/2023 0731   LYMPHSABS 1.5 08/10/2013 1552   MONOABS 0.5 06/06/2023 0731   MONOABS 0.4 08/10/2013 1552   EOSABS 0.1 06/06/2023 0731   EOSABS 0.1 08/10/2013 1552   BASOSABS 0.0 06/06/2023 0731   BASOSABS 0.0 08/10/2013 1552    Pulmonary Functions Testing Results:     No data to display          Outpatient Medications Prior to Visit  Medication Sig Dispense Refill   Iron, Ferrous Sulfate, 325 (65 Fe) MG TABS      levothyroxine (SYNTHROID) 50 MCG tablet TAKE 1 TABLET BY MOUTH ONCE DAILY BEFORE BREAKFAST 90 tablet 1   MAGNESIUM GLUCONATE PO Take 2 capsules by mouth daily in the afternoon.     No facility-administered medications prior to visit.

## 2023-07-29 ENCOUNTER — Telehealth: Payer: Self-pay | Admitting: Gastroenterology

## 2023-07-29 NOTE — Telephone Encounter (Signed)
Good Morning Dr. Lavon Paganini,  Patient called stating that she wanted to schedule her colonoscopy. I advised patient that she was on a 5 year recall and was not due until 2026. Patient stated she does not want to wait and she has had some stool changes and the color of her stool as well. Patient stated she is self pay and doesn't have to worry about insurance. Will you please review and advise on scheduling patient?  Thank you.

## 2023-07-29 NOTE — Telephone Encounter (Signed)
Please schedule patient for office visit soon either with me or APP to evaluate her symptoms and see if it is appropriate to proceed with diagnostic colonoscopy.  Thank you

## 2023-08-03 ENCOUNTER — Other Ambulatory Visit (INDEPENDENT_AMBULATORY_CARE_PROVIDER_SITE_OTHER): Payer: Self-pay

## 2023-08-03 DIAGNOSIS — D649 Anemia, unspecified: Secondary | ICD-10-CM

## 2023-08-03 LAB — CBC WITH DIFFERENTIAL/PLATELET
Basophils Absolute: 0 10*3/uL (ref 0.0–0.1)
Basophils Relative: 0.4 % (ref 0.0–3.0)
Eosinophils Absolute: 0.1 10*3/uL (ref 0.0–0.7)
Eosinophils Relative: 1.2 % (ref 0.0–5.0)
HCT: 42.9 % (ref 36.0–46.0)
Hemoglobin: 13.7 g/dL (ref 12.0–15.0)
Lymphocytes Relative: 26.5 % (ref 12.0–46.0)
Lymphs Abs: 1.7 10*3/uL (ref 0.7–4.0)
MCHC: 32 g/dL (ref 30.0–36.0)
MCV: 83.6 fl (ref 78.0–100.0)
Monocytes Absolute: 0.5 10*3/uL (ref 0.1–1.0)
Monocytes Relative: 7.3 % (ref 3.0–12.0)
Neutro Abs: 4.2 10*3/uL (ref 1.4–7.7)
Neutrophils Relative %: 64.6 % (ref 43.0–77.0)
Platelets: 176 10*3/uL (ref 150.0–400.0)
RBC: 5.13 Mil/uL — ABNORMAL HIGH (ref 3.87–5.11)
RDW: 21.9 % — ABNORMAL HIGH (ref 11.5–15.5)
WBC: 6.4 10*3/uL (ref 4.0–10.5)

## 2023-08-03 LAB — FERRITIN: Ferritin: 50.7 ng/mL (ref 10.0–291.0)

## 2023-08-05 ENCOUNTER — Other Ambulatory Visit: Payer: Self-pay

## 2023-08-05 DIAGNOSIS — D649 Anemia, unspecified: Secondary | ICD-10-CM

## 2023-08-11 NOTE — Telephone Encounter (Signed)
Patient scheduled for ov with provider.

## 2023-08-18 ENCOUNTER — Telehealth: Payer: Self-pay | Admitting: Internal Medicine

## 2023-08-18 NOTE — Telephone Encounter (Signed)
Patient just called and would like for Bethann Berkshire to call her. She has a question to ask her. Her number is [phone].

## 2023-08-19 NOTE — Telephone Encounter (Signed)
LM for patient. When she calls back, Dr Lorin Picket has given the ok to move her husbands 10/31 appt and put her daughter in that spot. Please get daughters information and place her in 10/31 appt slot.

## 2023-08-19 NOTE — Telephone Encounter (Signed)
Patient says that you agreed to take her husband and daughter as new patients. Husband has appt 10/31 and patient is wondering if her daughter can have this appt instead. Okay with you?

## 2023-08-19 NOTE — Telephone Encounter (Signed)
Ok.  Please confirm no acute issues.

## 2023-08-24 ENCOUNTER — Other Ambulatory Visit: Payer: Self-pay | Admitting: Internal Medicine

## 2023-08-25 NOTE — Telephone Encounter (Signed)
Daughter scheduled. Husband rescheduled.

## 2023-09-19 ENCOUNTER — Other Ambulatory Visit (INDEPENDENT_AMBULATORY_CARE_PROVIDER_SITE_OTHER): Payer: Self-pay

## 2023-09-19 DIAGNOSIS — D649 Anemia, unspecified: Secondary | ICD-10-CM

## 2023-09-19 LAB — CBC WITH DIFFERENTIAL/PLATELET
Basophils Absolute: 0 10*3/uL (ref 0.0–0.1)
Basophils Relative: 0.6 % (ref 0.0–3.0)
Eosinophils Absolute: 0.1 10*3/uL (ref 0.0–0.7)
Eosinophils Relative: 1.2 % (ref 0.0–5.0)
HCT: 44.2 % (ref 36.0–46.0)
Hemoglobin: 14.2 g/dL (ref 12.0–15.0)
Lymphocytes Relative: 28.9 % (ref 12.0–46.0)
Lymphs Abs: 1.7 10*3/uL (ref 0.7–4.0)
MCHC: 32 g/dL (ref 30.0–36.0)
MCV: 86.7 fl (ref 78.0–100.0)
Monocytes Absolute: 0.5 10*3/uL (ref 0.1–1.0)
Monocytes Relative: 7.5 % (ref 3.0–12.0)
Neutro Abs: 3.7 10*3/uL (ref 1.4–7.7)
Neutrophils Relative %: 61.8 % (ref 43.0–77.0)
Platelets: 165 10*3/uL (ref 150.0–400.0)
RBC: 5.1 Mil/uL (ref 3.87–5.11)
RDW: 18.3 % — ABNORMAL HIGH (ref 11.5–15.5)
WBC: 6 10*3/uL (ref 4.0–10.5)

## 2023-09-19 LAB — FERRITIN: Ferritin: 21.2 ng/mL (ref 10.0–291.0)

## 2023-11-26 ENCOUNTER — Other Ambulatory Visit: Payer: Self-pay | Admitting: Internal Medicine

## 2023-12-13 ENCOUNTER — Ambulatory Visit: Payer: Self-pay | Admitting: Gastroenterology

## 2023-12-15 ENCOUNTER — Ambulatory Visit: Payer: Self-pay | Admitting: Gastroenterology

## 2023-12-19 IMAGING — MG MM DIGITAL SCREENING BILAT W/ TOMO AND CAD
8 series · 9 of 24 positions shown · non-contrast
Comparison: Previous exam(s).

CLINICAL DATA: Screening.

EXAM:
DIGITAL SCREENING BILATERAL MAMMOGRAM WITH TOMOSYNTHESIS AND CAD
TECHNIQUE: Bilateral screening digital craniocaudal and mediolateral oblique
mammograms were obtained. Bilateral screening digital breast
tomosynthesis was performed. The images were evaluated with
computer-aided detection.

[L CC synth-2D]
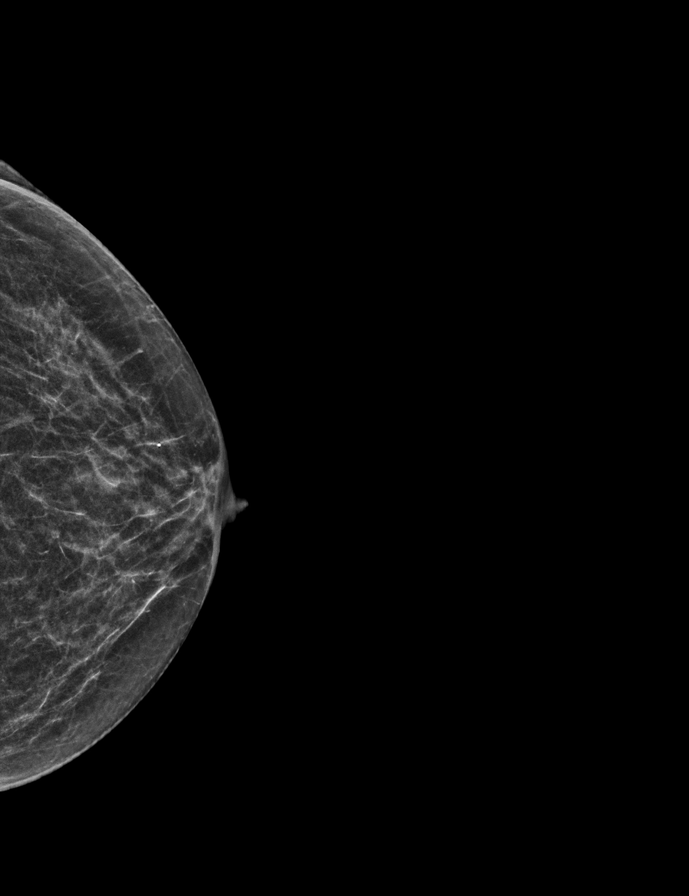

[L MLO synth-2D]
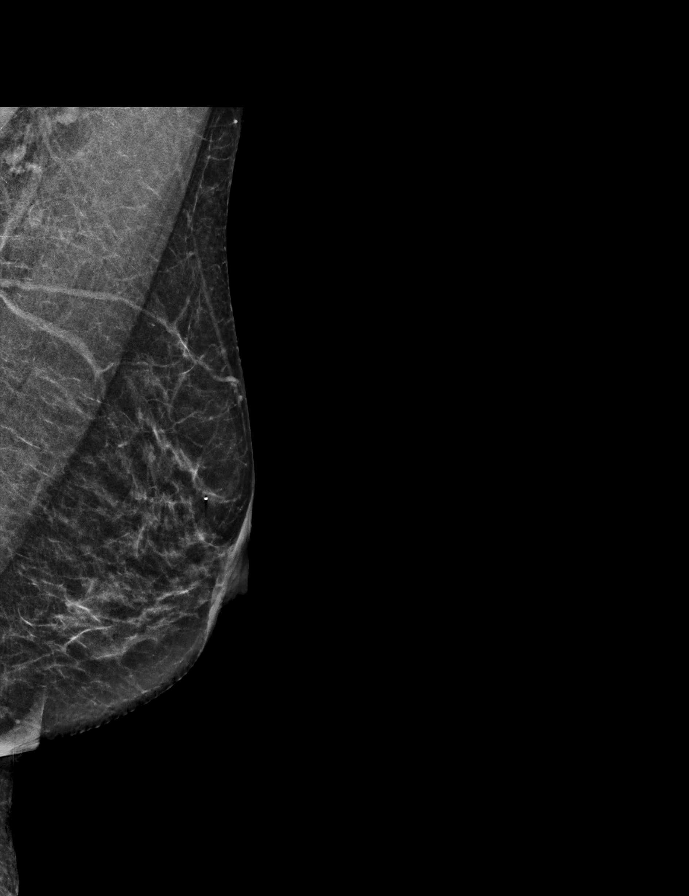

[R CC synth-2D]
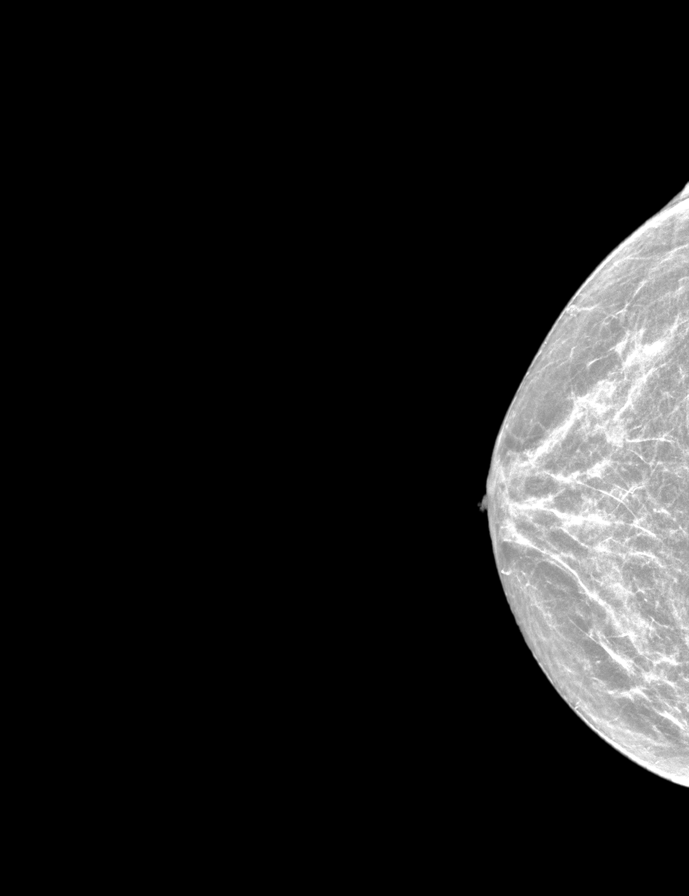

[R MLO synth-2D]
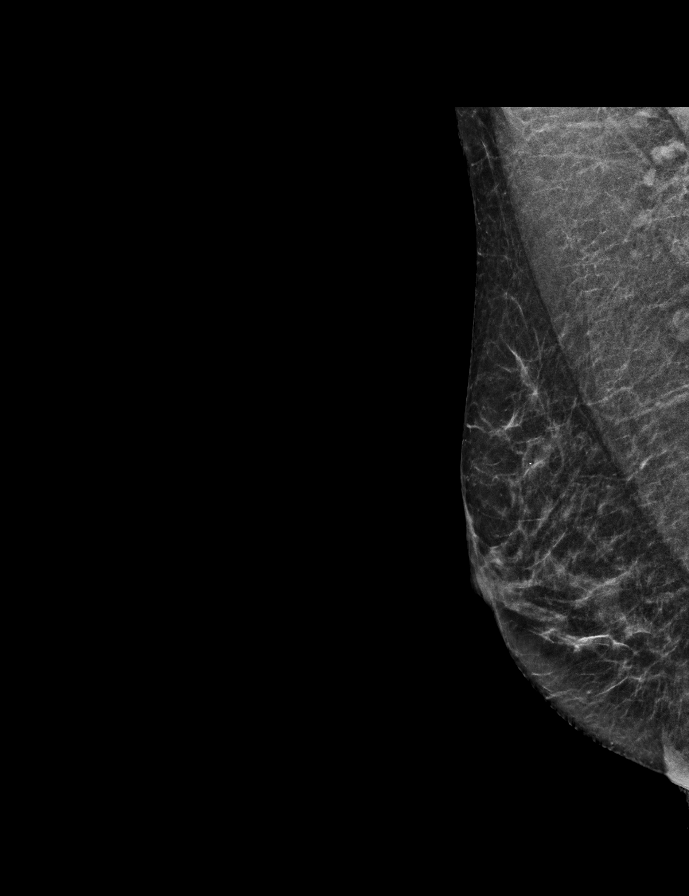

[L MLO tomo · 2 of 48 frames shown]
[frame 16/48]
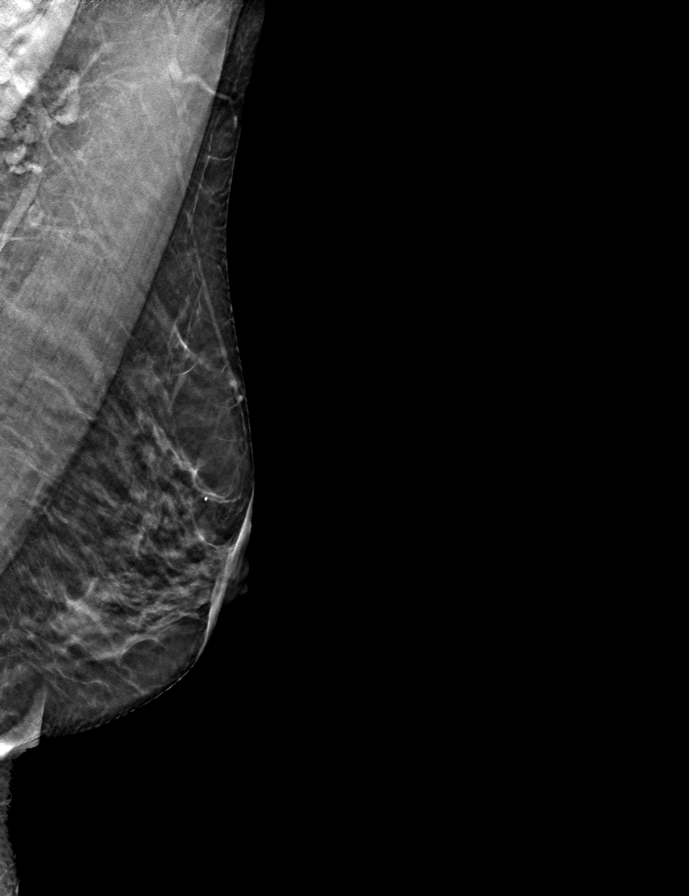
[frame 25/48]
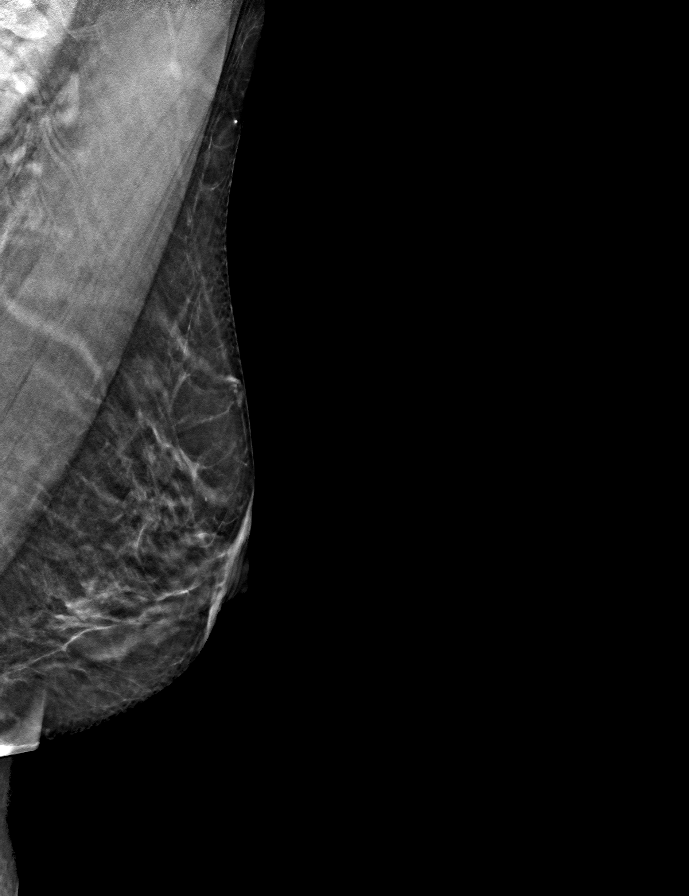

[R CC tomo · tomo slice 23/44.0]
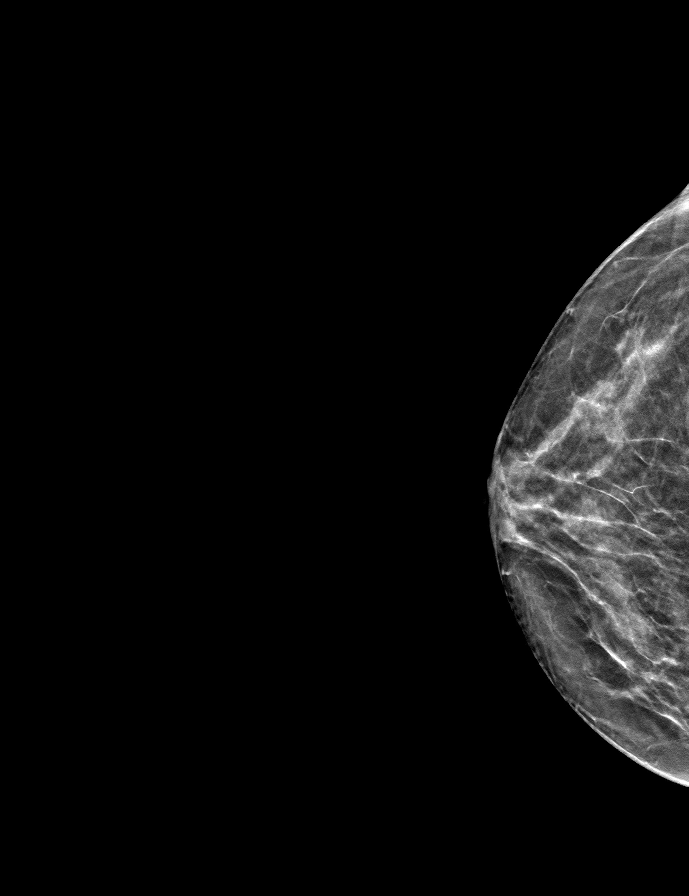

[R MLO tomo · tomo slice 24/47.0]
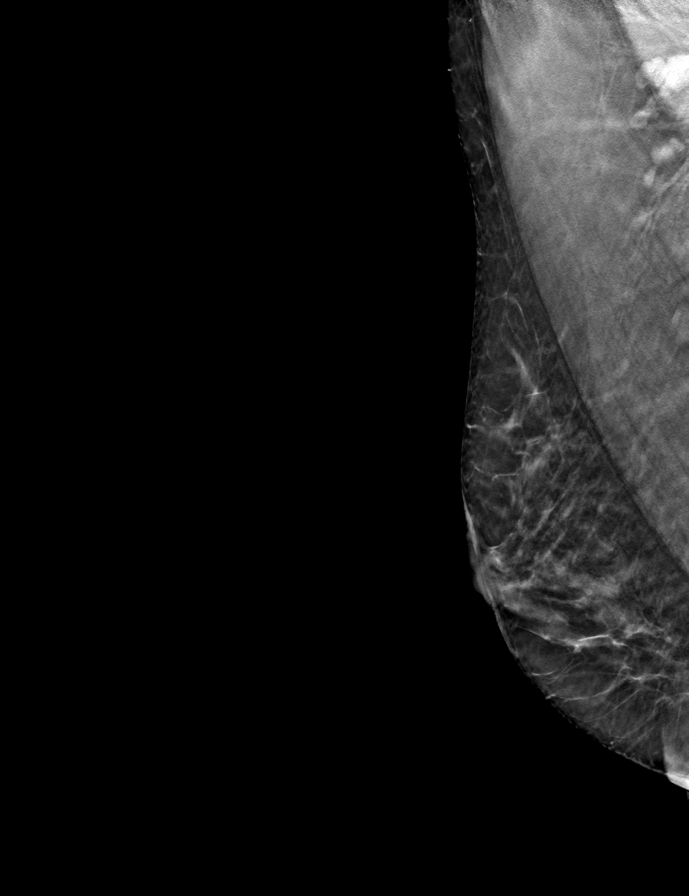

[L CC tomo · tomo slice 21/41.0]
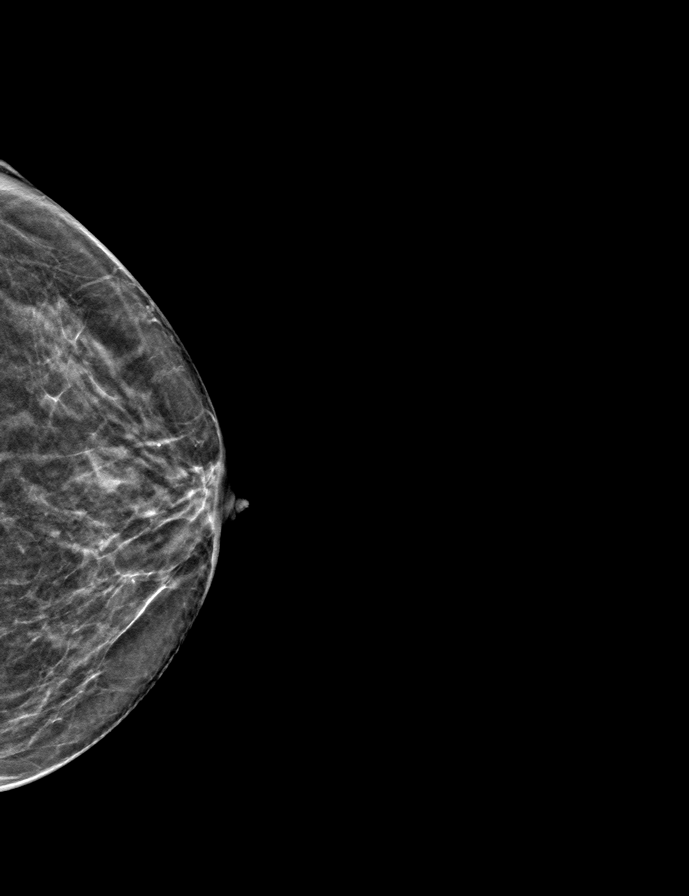

[9 of 24 positions shown; findings below may reference images not displayed]

ACR Breast Density Category b: There are scattered areas of
fibroglandular density.
FINDINGS: There are no findings suspicious for malignancy.
IMPRESSION: No mammographic evidence of malignancy. A result letter of this
screening mammogram will be mailed directly to the patient.

RECOMMENDATION:
Screening mammogram in one year. (Code:51-O-LD2)

BI-RADS CATEGORY  1: Negative.

## 2024-01-30 ENCOUNTER — Ambulatory Visit: Payer: Self-pay | Admitting: Internal Medicine

## 2024-01-30 NOTE — Telephone Encounter (Signed)
 Agree with need for evaluation and can place order for cardiology referral. Confirm ok with waiting until Wednesday. If any acute change or if increase symptoms today - needs earlier evaluation.

## 2024-01-30 NOTE — Telephone Encounter (Signed)
 Pt aware of below and scheduled for Thursday. No availability on Wednesday.

## 2024-01-30 NOTE — Telephone Encounter (Signed)
 Called patient to follow up with her. She says that last Friday night she had an episode of feeling like her heart was racing and then again last night she had another episode. She denies any active chest pain, pressure, sob, etc. She says she just doesn't feel great today. No acute symptoms. She wants to see a cardiologist. Advised she did not need to wait for cardiology referral to be evaluated. Patient agreed. Are you ok with seeing her on Wednesday or do you want me to schedule acute visit with another provider? I did advise that if anything acute changes, she needed to be evaluated ASAP at ED or urgent care. Pt gave verbal understanding.

## 2024-01-30 NOTE — Telephone Encounter (Signed)
 Call was dropped during transfer to Nurse Triage. Attempted to call patient back but unable to speak with her. "Call cannot be completed as dialed." Will place in callbacks."  Copied from CRM [id number]. Topic: Clinical - Red Word Triage >> Jan 30, 2024 10:11 AM Michelle Roach wrote: [facility] that prompted transfer to Nurse Triage: pt called stating she has irregular heart beat. Requested to speak with a nurse.

## 2024-01-30 NOTE — Telephone Encounter (Addendum)
 3rd attempt to call patient without success. Will route to clinic.   Copied from CRM [id number]. Topic: Clinical - Red Word Triage >> Jan 30, 2024 10:11 AM Sonny Dandy B wrote: [facility] that prompted transfer to Nurse Triage: pt called stating she has irregular heart beat. Requested to speak with a nurse.

## 2024-01-31 ENCOUNTER — Ambulatory Visit: Payer: Self-pay

## 2024-02-02 ENCOUNTER — Ambulatory Visit: Payer: Self-pay | Admitting: Internal Medicine

## 2024-02-20 ENCOUNTER — Encounter: Payer: Self-pay | Admitting: Gastroenterology

## 2024-02-20 ENCOUNTER — Ambulatory Visit (INDEPENDENT_AMBULATORY_CARE_PROVIDER_SITE_OTHER): Payer: Self-pay | Admitting: Gastroenterology

## 2024-02-20 ENCOUNTER — Other Ambulatory Visit (INDEPENDENT_AMBULATORY_CARE_PROVIDER_SITE_OTHER): Payer: Self-pay

## 2024-02-20 VITALS — BP 110/78 | HR 76 | Ht 64.0 in | Wt 137.4 lb

## 2024-02-20 DIAGNOSIS — K921 Melena: Secondary | ICD-10-CM

## 2024-02-20 DIAGNOSIS — D500 Iron deficiency anemia secondary to blood loss (chronic): Secondary | ICD-10-CM

## 2024-02-20 DIAGNOSIS — R911 Solitary pulmonary nodule: Secondary | ICD-10-CM

## 2024-02-20 LAB — B12 AND FOLATE PANEL
Folate: 13.8 ng/mL (ref >5.9)
Vitamin B-12: 767 pg/mL (ref 211–911)

## 2024-02-20 LAB — CBC WITH DIFFERENTIAL/PLATELET
Basophils Absolute: 0 10*3/uL (ref 0.0–0.1)
Basophils Relative: 0.3 % (ref 0.0–3.0)
Eosinophils Absolute: 0.1 10*3/uL (ref 0.0–0.7)
Eosinophils Relative: 0.7 % (ref 0.0–5.0)
HCT: 41.8 % (ref 36.0–46.0)
Hemoglobin: 14 g/dL (ref 12.0–15.0)
Lymphocytes Relative: 15.3 % (ref 12.0–46.0)
Lymphs Abs: 1.4 10*3/uL (ref 0.7–4.0)
MCHC: 33.5 g/dL (ref 30.0–36.0)
MCV: 88.3 fl (ref 78.0–100.0)
Monocytes Absolute: 0.4 10*3/uL (ref 0.1–1.0)
Monocytes Relative: 4.6 % (ref 3.0–12.0)
Neutro Abs: 7.1 10*3/uL (ref 1.4–7.7)
Neutrophils Relative %: 79.1 % — ABNORMAL HIGH (ref 43.0–77.0)
Platelets: 169 10*3/uL (ref 150.0–400.0)
RBC: 4.74 Mil/uL (ref 3.87–5.11)
RDW: 14.3 % (ref 11.5–15.5)
WBC: 9 10*3/uL (ref 4.0–10.5)

## 2024-02-20 MED ORDER — NA SULFATE-K SULFATE-MG SULF 17.5-3.13-1.6 GM/177ML PO SOLN: 1.0000 | Freq: Once | ORAL | 0 refills | Status: AC

## 2024-02-20 NOTE — Patient Instructions (Addendum)
 We have given you samples of the following medication to take: Sutab  VISIT SUMMARY:  Today, we discussed your concerns about dark, tarry stools and reviewed your medical history, including your previous low iron levels and the small lung nodule found on a past cardiac scan. We have planned further evaluations to investigate the cause of your symptoms and to monitor your overall health.  YOUR PLAN:  -MELENA: Melena refers to dark, tarry stools that can indicate bleeding in the upper part of your digestive system. To investigate this, we have scheduled an upper endoscopy and colonoscopy for July 15. These procedures will help [place] identify any potential sources of bleeding. We will also perform blood work today to check your blood counts and iron levels. Both procedures will be done under sedation to ensure your comfort.  -IRON DEFICIENCY ANEMIA: Iron deficiency anemia occurs when your body doesn't have enough iron to produce adequate red blood cells. This can be due to blood loss, such as from the gastrointestinal tract. Your iron levels have improved since last year, but we will perform blood work today to reassess your current levels. Depending on the results, you may need to resume iron supplements.  -PULMONARY NODULE: A pulmonary nodule is a small growth in the lung. While your pulmonologist is not currently concerned, it is important to follow up to ensure it is benign. Please make sure to complete the follow-up scan for the pulmonary nodule in July.  INSTRUCTIONS:  Please complete the blood work today as discussed. Your upper endoscopy and colonoscopy are scheduled for July 15. Ensure you follow up on the pulmonary nodule with a scan in July. We will review the results of your blood work and procedures to determine the next steps in your care.  Your provider has requested that you go to the basement level for lab work before leaving today. Press "B" on the elevator. The lab is located at the  first door on the left as you exit the elevator.   Due to recent changes in healthcare laws, you may see the results of your imaging and laboratory studies on MyChart before your provider has had a chance to review them.  We understand that in some cases there may be results that are confusing or concerning to you. Not all laboratory results come back in the same time frame and the provider may be waiting for multiple results in order to interpret others.  Please give [place] 48 hours in order for your provider to thoroughly review all the results before contacting the office for clarification of your results.    I appreciate the  opportunity to care for you  Thank You   Marsa Aris , MD

## 2024-02-20 NOTE — Progress Notes (Signed)
 Michelle Roach    [id number]    1961-06-19  Primary Care Physician:Scott, Westley Hummer, MD  Referring Physician: Dale [place], MD [address] [place],  [place] [address]   Chief complaint:  dark stool, iron deficiency  Discussed the use of AI scribe software for clinical note transcription with the patient, who gave verbal consent to proceed.  History of Present Illness Michelle Roach is a 63 year old female who presents with concerns about dark, tarry stools.  She noticed a change in her bowel movements several months ago, with stools appearing darker and tarry, described as 'sticky' and different from her usual pattern. This persisted for a couple of months before resolving. Currently, she does not experience tarry stools but mentions occasional mucus in her stool. No use of NSAIDs such as ibuprofen, Aleve, or Advil.  She has a history of low iron levels, with a ferritin level of 5 in July of the previous year, which has since improved to 81 as of November. She was taking iron supplements but has stopped after her last blood work. No current symptoms of stomach issues, fatigue, or weakness, and she remains active, having recently completed an exercise class.  A past cardiac scan showed a zero calcium score but revealed a small lung nodule, which she is scheduled to follow up on in July.  She is transitioning to [facility] in July and is coordinating her healthcare appointments accordingly. She is active and recently completed an exercise class, indicating good general health.  Colonoscopy 08/21/20 - Diverticulosis in the sigmoid colon. - Non- bleeding internal hemorrhoids.   Family history of colon cancer in her father   Outpatient Encounter Medications as of 02/20/2024  Medication Sig   Iron, Ferrous Sulfate, 325 (65 Fe) MG TABS    levothyroxine (SYNTHROID) 50 MCG tablet TAKE 1 TABLET BY MOUTH ONCE DAILY BEFORE BREAKFAST   MAGNESIUM GLUCONATE PO  Take 2 capsules by mouth daily in the afternoon.   No facility-administered encounter medications on file as of 02/20/2024.    Allergies as of 02/20/2024   (No Known Allergies)    Past Medical History:  Diagnosis Date   Cancer [facility])    History of colon polyps    Melanoma in situ (HCC)    right leg   Thyroid disease    Vertigo    saw MD for this 08-12-20- cleared up the next day    Past Surgical History:  Procedure Laterality Date   COLONOSCOPY  2016   TONSILLECTOMY  1966   TUBAL LIGATION  1994    Family History  Problem Relation Age of Onset   Hypertension Mother    Diabetes Mother    Cancer Father        colon   Colon cancer Father    Esophageal cancer Father    Diabetes Maternal Grandmother    Breast cancer Neg Hx    Rectal cancer Neg Hx    Stomach cancer Neg Hx     Social History   Socioeconomic History   Marital status: Married    Spouse name: Not on file   Number of children: Not on file   Years of education: Not on file   Highest education level: Not on file  Occupational History   Not on file  Tobacco Use   Smoking status: Never   Smokeless tobacco: Never  Vaping Use   Vaping status: Never Used  Substance and Sexual Activity  Alcohol use: Yes    Alcohol/week: 0.0 standard drinks of alcohol    Comment: rarely   Drug use: No   Sexual activity: Not on file  Other Topics Concern   Not on file  Social History Narrative   Not on file   Social Drivers of Health   Financial Resource Strain: Not on file  Food Insecurity: Not on file  Transportation Needs: Not on file  Physical Activity: Not on file  Stress: Not on file  Social Connections: Not on file  Intimate Partner Violence: Not on file      Review of systems: All other review of systems negative except as mentioned in the HPI.   Physical Exam: Vitals:   02/20/24 1400  BP: 110/78  Pulse: 76   Body mass index is 23.58 kg/m. Gen:      No acute distress HEENT:  sclera  anicteric CV: s1s2 rrr, no murmur Lungs: B/l clear. Abd:      soft, non-tender; no palpable masses, no distension Ext:    No edema Neuro: alert and oriented x 3 Psych: normal mood and affect  Data Reviewed:  Reviewed labs, radiology imaging, old records and pertinent past GI work up     Assessment and Plan Assessment & Plan Melena Intermittent episodes of dark, tarry stools suggestive of melena, indicating potential upper gastrointestinal bleeding. Concern exists for bleeding from the stomach, such as an ulcer or gastritis, or from the small bowel. Low iron levels and family history of colon cancer, will need to further evaluate iron deficiency - Order upper endoscopy and colonoscopy on July 15 to evaluate for potential sources of bleeding. - Perform blood work today, including CBC, iron panel, B12, and folate levels, to assess current blood counts and iron status. - Discussed that both procedures will be performed under sedation, ensuring patient comfort. The risks and benefits as well as alternatives of endoscopic procedure(s) have been discussed and reviewed. All questions answered. The patient agrees to proceed.   Iron deficiency anemia Low iron levels, with a ferritin level of 5 in July of last year, improved to 21 by November, suggest iron deficiency anemia, potentially secondary to gastrointestinal blood loss. She has stopped taking iron supplements after the last blood work. Monitoring and potential resumption of iron supplementation are necessary based on upcoming blood work results. - Perform blood work today to assess current iron levels and blood counts. - Consider resuming iron supplementation based on blood work results.  Pulmonary nodule A small pulmonary nodule was incidentally found on a cardiac scan. The pulmonologist is not currently concerned about the nodule, but follow-up is necessary to ensure it is benign. - Ensure follow-up scan for the pulmonary nodule is  completed in July.   The patient was provided an opportunity to ask questions and all were answered. The patient agreed with the plan and demonstrated an understanding of the instructions.  Iona Beard , MD    CC: Dale [place], MD

## 2024-02-22 ENCOUNTER — Other Ambulatory Visit: Payer: Self-pay | Admitting: Internal Medicine

## 2024-03-02 ENCOUNTER — Encounter: Payer: Self-pay | Admitting: Gastroenterology

## 2024-05-16 ENCOUNTER — Telehealth: Payer: Self-pay

## 2024-05-16 ENCOUNTER — Telehealth: Payer: Self-pay | Admitting: Gastroenterology

## 2024-05-16 DIAGNOSIS — D649 Anemia, unspecified: Secondary | ICD-10-CM

## 2024-05-16 DIAGNOSIS — R0609 Other forms of dyspnea: Secondary | ICD-10-CM

## 2024-05-16 DIAGNOSIS — R55 Syncope and collapse: Secondary | ICD-10-CM

## 2024-05-16 DIAGNOSIS — R002 Palpitations: Secondary | ICD-10-CM

## 2024-05-16 DIAGNOSIS — E039 Hypothyroidism, unspecified: Secondary | ICD-10-CM

## 2024-05-16 DIAGNOSIS — E7800 Pure hypercholesterolemia, unspecified: Secondary | ICD-10-CM

## 2024-05-16 NOTE — Telephone Encounter (Signed)
 Copied from CRM [id number]. Topic: Referral - Question >> May 16, 2024  4:27 PM Sasha H wrote: Reason for CRM: Pt is wanting a referral to [facility]

## 2024-05-16 NOTE — Telephone Encounter (Signed)
 Inbound call from patient, states she has been having some tachycardia and would like to know if it is okay to proceed with procedures on 7/15. She states she called her PCP and has to be referred to cardiology.

## 2024-05-17 ENCOUNTER — Encounter: Payer: Self-pay | Admitting: Emergency Medicine

## 2024-05-17 ENCOUNTER — Ambulatory Visit
Admission: EM | Admit: 2024-05-17 | Discharge: 2024-05-17 | Disposition: A | Discharge status: Home / Self Care | Source: Ambulatory Visit | Attending: Emergency Medicine | Admitting: Emergency Medicine

## 2024-05-17 DIAGNOSIS — D649 Anemia, unspecified: Secondary | ICD-10-CM | POA: Insufficient documentation

## 2024-05-17 LAB — CBC WITH DIFFERENTIAL/PLATELET
Abs Immature Granulocytes: 0.01 10*3/uL (ref 0.00–0.07)
Basophils Absolute: 0 10*3/uL (ref 0.0–0.1)
Basophils Relative: 1 %
Eosinophils Absolute: 0.1 10*3/uL (ref 0.0–0.5)
Eosinophils Relative: 1 %
HCT: 34.7 % — ABNORMAL LOW (ref 36.0–46.0)
Hemoglobin: 11.5 g/dL — ABNORMAL LOW (ref 12.0–15.0)
Immature Granulocytes: 0 %
Lymphocytes Relative: 29 %
Lymphs Abs: 1.5 10*3/uL (ref 0.7–4.0)
MCH: 28.9 pg (ref 26.0–34.0)
MCHC: 33.1 g/dL (ref 30.0–36.0)
MCV: 87.2 fL (ref 80.0–100.0)
Monocytes Absolute: 0.3 10*3/uL (ref 0.1–1.0)
Monocytes Relative: 6 %
Neutro Abs: 3.2 10*3/uL (ref 1.7–7.7)
Neutrophils Relative %: 63 %
Platelets: 210 10*3/uL (ref 150–400)
RBC: 3.98 MIL/uL (ref 3.87–5.11)
RDW: 14.1 % (ref 11.5–15.5)
WBC: 5.2 10*3/uL (ref 4.0–10.5)
nRBC: 0 % (ref 0.0–0.2)

## 2024-05-17 LAB — BASIC METABOLIC PANEL WITH GFR
Anion gap: 10 (ref 5–15)
BUN: 19 mg/dL (ref 8–23)
CO2: 24 mmol/L (ref 22–32)
Calcium: 9.1 mg/dL (ref 8.9–10.3)
Chloride: 104 mmol/L (ref 98–111)
Creatinine, Ser: 1 mg/dL (ref 0.44–1.00)
GFR, Estimated: >60 mL/min (ref >60)
Glucose, Bld: 90 mg/dL (ref 70–99)
Potassium: 4.3 mmol/L (ref 3.5–5.1)
Sodium: 138 mmol/L (ref 135–145)

## 2024-05-17 NOTE — Telephone Encounter (Signed)
 Spoke to Michelle Roach about her urgent care visit. She is doing fine now. Reviewed UC note. Disscussedneed for cardiology referral. Agreeable. Will restart iron. Schedule fasting labs next week. She informed me she canceled colonoscopy. Will reschedule once above issues sorted through.

## 2024-05-17 NOTE — Telephone Encounter (Signed)
 Patient seen at [facility] care today .  Per pt EKG was fine, per UC note Normal rate and regular rhythm.  Pt had a colonoscopy scheduled for 05/29/24 but was encouraged to cancel appointment.  Pt would like to know if she can reschedule the colonoscopy,  per pt the provider at [facility] felt that symptoms were coming from anemia.   Please advise if she can reschedule colonoscopy.

## 2024-05-17 NOTE — Telephone Encounter (Signed)
 Agree with evaluation to confirm nothing acute going on.

## 2024-05-17 NOTE — Discharge Instructions (Addendum)
 Your blood work today showed that showed normal electrolytes and kidney function.  Your CBC does show that you have very mild anemia.  This may be contributing to your symptoms.  You need to make a follow-up appointment with your primary care provider to discuss this blood work.  They can order repeat iron studies to see if you have low iron again which is contributing to your anemia.  They can also make a referral to cardiology for evaluation of your symptoms if they do not improve with management of your anemia.

## 2024-05-17 NOTE — Telephone Encounter (Signed)
 Called patient to get more information. Patient complaining of sob on exertion, tachycardic episodes at night, weaker than normal (unable to grate a lemon standing up without having to sit down). She also noted that she passed out a couple of weeks ago and she can tell that something just does not feel right, she feels different. Advised patient that we can refer to cardiology but she needs to go to urgent care or ED to confirm nothing acute first. She is scheduled for colonoscopy next week. Advised that she cannot have procedure until confirmed no cardiac issues. She says she is going to [facility].

## 2024-05-17 NOTE — Telephone Encounter (Unsigned)
 Copied from CRM [id number]. Topic: General - Other >> May 17, 2024  2:12 PM Suzen RAMAN wrote: Reason for CRM: Patient would like a call back from [place] regarding a referral to urgent care earlier today. [phone]

## 2024-05-17 NOTE — Telephone Encounter (Signed)
 Patient called to cancel procedure, states Primary Care advised her to cancel while they solved her cardiac issues.

## 2024-05-17 NOTE — Telephone Encounter (Signed)
 ok

## 2024-05-17 NOTE — ED Triage Notes (Signed)
 Patient states that she passed out a few weeks ago after donating blood.   Dr Glendia told her to come here. Heart palpitations off and on for a few months.

## 2024-05-17 NOTE — ED Provider Notes (Signed)
 [facility]    CSN: [id number] Arrival date & time: 05/17/24  1255      History   Chief Complaint Chief Complaint  Patient presents with   Palpitations    HPI Michelle Roach is a 63 y.o. female.   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  These have been going on for a number of months.  She reports that 2 weeks ago she donated blood and several hours after donating blood she had a syncopal event.  She has not had a repeat of syncope since then.  She reports that earlier in the week she was cooking dinner and became dizzy requiring her to sit down.  She also reported that later on in the evening she had significant fatigue with minimal exertion.  Yesterday she attended her normal exercise class which consisted of lifting weights and reported that she became short of breath with exertion.  She denies any chest pain.  Patient reports that she came in today to be evaluated because Dr. Glendia told her to come in.  No new symptoms.  Past Medical History:  Diagnosis Date   Cancer [facility])    History of colon polyps    Melanoma in situ (HCC)    right leg   Thyroid  disease    Vertigo    saw MD for this 08-12-20- cleared up the next day    Patient Active Problem List   Diagnosis Date Noted   Lung nodule 07/28/2023   Anemia 06/05/2020   Itching 03/18/2019   Chest pain 08/13/2017   Elevated blood pressure reading 08/13/2017   Hypothyroidism 05/05/2016   Nonspecific abnormal results of thyroid  function study 04/09/2016   Hypercholesterolemia 03/02/2015   Health care maintenance 03/02/2015   Ankle pain, right 02/10/2014   Change in vision 02/10/2014   History of melanoma in situ 02/04/2013   History of colonic polyps 02/04/2013     Past Surgical History:  Procedure Laterality Date   COLONOSCOPY  2016   TONSILLECTOMY  1966   TUBAL LIGATION  1994    OB History   No obstetric history on file.      Home Medications    Prior to Admission medications   Medication Sig Start Date End Date Taking? Authorizing Provider  levothyroxine  (SYNTHROID ) 50 MCG tablet TAKE 1 TABLET BY MOUTH ONCE DAILY BEFORE BREAKFAST 02/23/24  Yes Glendia Shad, MD  MAGNESIUM GLUCONATE PO Take 2 capsules by mouth daily in the afternoon.   Yes [provider]  Multiple Vitamins-Minerals (PRESERVISION AREDS PO) Take by mouth daily.    [provider]    Family History Family History  Problem Relation Age of Onset   Hypertension Mother    Diabetes Mother    Cancer Father        colon   Colon cancer Father    Esophageal cancer Father    Diabetes Maternal Grandmother    Breast cancer Neg Hx    Rectal cancer Neg Hx    Stomach cancer Neg Hx     Social History Social History   Tobacco Use   Smoking status: Never   Smokeless tobacco: Never  Vaping Use   Vaping status: Never Used  Substance Use Topics   Alcohol use: Yes    Alcohol/week: 0.0 standard drinks of alcohol  Comment: rarely   Drug use: No     Allergies   Patient has no known allergies.   Review of Systems Review of Systems  Constitutional:  Positive for fatigue.  Respiratory:  Positive for shortness of breath.   Cardiovascular:  Positive for palpitations. Negative for chest pain.  Neurological:  Positive for dizziness. Negative for syncope.     Physical Exam Triage Vital Signs ED Triage Vitals  Encounter Vitals Group     BP      Girls Systolic BP Percentile      Girls Diastolic BP Percentile      Boys Systolic BP Percentile      Boys Diastolic BP Percentile      Pulse      Resp      Temp      Temp src      SpO2      Weight      Height      Head Circumference      Peak Flow      Pain Score      Pain Loc      Pain  Education      Exclude from Growth Chart    No data found.  Updated Vital Signs BP 125/81 (BP Location: Right Arm)   Pulse 66   Temp 97.7 F (36.5 C) (Oral)   Resp 17   LMP 02/02/2009   SpO2 96%   Visual Acuity Right Eye Distance:   Left Eye Distance:   Bilateral Distance:    Right Eye Near:   Left Eye Near:    Bilateral Near:     Physical Exam Vitals and nursing note reviewed.  Constitutional:      Appearance: Normal appearance. She is not ill-appearing.  HENT:     Head: Normocephalic and atraumatic.     Mouth/Throat:     Mouth: Mucous membranes are moist.     Pharynx: Oropharynx is clear. No oropharyngeal exudate or posterior oropharyngeal erythema.  Eyes:     General: No scleral icterus.    Extraocular Movements: Extraocular movements intact.     Conjunctiva/sclera: Conjunctivae normal.     Pupils: Pupils are equal, round, and reactive to light.  Cardiovascular:     Rate and Rhythm: Normal rate and regular rhythm.     Pulses: Normal pulses.     Heart sounds: Normal heart sounds. No murmur heard.    No friction rub. No gallop.  Pulmonary:     Effort: Pulmonary effort is normal.     Breath sounds: Normal breath sounds. No wheezing, rhonchi or rales.  Skin:    General: Skin is warm and dry.     Capillary Refill: Capillary refill takes less than 2 seconds.     Findings: No rash.  Neurological:     General: No focal deficit present.     Mental Status: She is alert and oriented to person, place, and time.      UC Treatments / Results  Labs (all labs ordered are listed, but only abnormal results are displayed) Labs Reviewed  CBC WITH DIFFERENTIAL/PLATELET - Abnormal; Notable for the following components:      Result Value   Hemoglobin 11.5 (*)    HCT 34.7 (*)    All other components within normal limits  BASIC METABOLIC PANEL WITH GFR    EKG Normal sinus rhythm with ventricular rate of 63 bpm PR interval 154 ms QRS duration 118 ms QT/QTc 434/444  ms Possible left atrial  enlargement and right bundle branch block.  Radiology No results found.  Procedures Procedures (including critical care time)  Medications Ordered in UC Medications - No data to display  Initial Impression / Assessment and Plan / UC Course  I have reviewed the triage vital signs and the nursing notes.  Pertinent labs & imaging results that were available during my care of the patient were reviewed by me and considered in my medical decision making (see chart for details).   Patient is a nontoxic-appearing 63 year old female presenting for evaluation of ongoing palpitations, shortness of breath on exertion, and intermittent dizziness as outlined in the HPI above.  EKG shows normal sinus rhythm with possible left atrial lodgment and right bundle branch block.  These are not new findings.  These were present on EKG from 08/05/2017.  In the exam room patient is not in any acute distress.  She has not had another syncopal event.  Heart sounds are S1-S2 with regular rate and rhythm and lung sounds are clear to auscultation in all fields.  Her conjunctiva are not pale and neither are her oral mucous membranes.  I advised the patient that several things can cause palpitations to include electrolyte abnormality, anemia, mild dehydration, or cardiac arrhythmia.  Her EKG is only a snapshot in time but does not demonstrate any arrhythmia at present.  I told her that we can check for signs of anemia or electrolyte abnormality and I will order a CBC and a BMP.  The patient is inquiring about iron levels and have advised her that we cannot draw those at the urgent care because we are not primary care and we do not order send out labs.  She can follow-up with her primary care provider to have iron studies performed.  If her blood work is negative I will refer her back to her PCP for a referral to cardiology.  BMP shows normal sodium of 138, normal potassium of 4.3, normal chloride of 104,  and normal renal function with a BUN of 19 and a creatinine of 1.00.  CBC shows mild anemia with an H&H of 11.5 and 34.7 respectively.  Overall blood count is normal at 3.98 and platelets normal at 210.  Upon reviewing records in epic this H&H is an interval drop from 3 months ago when her H&H was 14 and 41.8.  I will discharge patient with a diagnosis of anemia and have her follow-up with her primary care provider.   Final Clinical Impressions(s) / UC Diagnoses   Final diagnoses:  Anemia, unspecified type     Discharge Instructions      Your blood work today showed that showed normal electrolytes and kidney function.  Your CBC does show that you have very mild anemia.  This may be contributing to your symptoms.  You need to make a follow-up appointment with your primary care provider to discuss this blood work.  They can order repeat iron studies to see if you have low iron again which is contributing to your anemia.  They can also make a referral to cardiology for evaluation of your symptoms if they do not improve with management of your anemia.     ED Prescriptions   None    PDMP not reviewed this encounter.   Bernardino Ditch, NP 05/17/24 1356

## 2024-05-17 NOTE — Telephone Encounter (Signed)
 Pt schedule for lab appointment 05/24/24 @ 10am.  Pt is aware that this is for fasting labs.  Dr. Glendia please enter orders.

## 2024-05-18 ENCOUNTER — Other Ambulatory Visit: Payer: Self-pay | Admitting: Internal Medicine

## 2024-05-18 DIAGNOSIS — R0602 Shortness of breath: Secondary | ICD-10-CM

## 2024-05-18 DIAGNOSIS — R55 Syncope and collapse: Secondary | ICD-10-CM

## 2024-05-18 DIAGNOSIS — R002 Palpitations: Secondary | ICD-10-CM

## 2024-05-18 NOTE — Telephone Encounter (Signed)
 Dicussed with Ms Frayne. Went to urgent care today. Reports recent history of palpitations and some sob noted with exercise. Had syncopal episode after giving blood recently. Reviewed labs from UC. Hgb decreased. Previously was on iron. Not taking now. Discussed restarting. Will check fasting labs next week - including cbc and iron studies. Currently feeling ok. Discussed holding on giving more blood. Discussed cardiology evaluation. Agreeable. Order placed for referral to cardiology. Discussed further w/up for anemia, but will need to sort through cardiac issues first. If any acute change or worsening symptoms, needs to be evaluated. Orders placed for labs to be drawn next week.

## 2024-05-18 NOTE — Progress Notes (Signed)
 Order placed for cardiology referral.

## 2024-05-18 NOTE — Addendum Note (Signed)
 Addended by: GLENDIA ALLENA RAMAN on: 05/18/2024 04:58 AM   Modules accepted: Orders

## 2024-05-24 ENCOUNTER — Other Ambulatory Visit (INDEPENDENT_AMBULATORY_CARE_PROVIDER_SITE_OTHER)

## 2024-05-24 DIAGNOSIS — E7800 Pure hypercholesterolemia, unspecified: Secondary | ICD-10-CM | POA: Diagnosis not present

## 2024-05-24 DIAGNOSIS — D649 Anemia, unspecified: Secondary | ICD-10-CM | POA: Diagnosis not present

## 2024-05-24 DIAGNOSIS — E039 Hypothyroidism, unspecified: Secondary | ICD-10-CM | POA: Diagnosis not present

## 2024-05-24 LAB — HEPATIC FUNCTION PANEL
ALT: 8 U/L (ref 0–35)
AST: 13 U/L (ref 0–37)
Albumin: 4.4 g/dL (ref 3.5–5.2)
Alkaline Phosphatase: 60 U/L (ref 39–117)
Bilirubin, Direct: 0 mg/dL (ref 0.0–0.3)
Total Bilirubin: 0.4 mg/dL (ref 0.2–1.2)
Total Protein: 7.1 g/dL (ref 6.0–8.3)

## 2024-05-24 LAB — BASIC METABOLIC PANEL WITH GFR
BUN: 17 mg/dL (ref 6–23)
CO2: 29 meq/L (ref 19–32)
Calcium: 9.2 mg/dL (ref 8.4–10.5)
Chloride: 106 meq/L (ref 96–112)
Creatinine, Ser: 0.92 mg/dL (ref 0.40–1.20)
GFR: 65.57 mL/min (ref >60.00)
Glucose, Bld: 92 mg/dL (ref 70–99)
Potassium: 4.4 meq/L (ref 3.5–5.1)
Sodium: 139 meq/L (ref 135–145)

## 2024-05-24 LAB — LIPID PANEL
Cholesterol: 279 mg/dL — ABNORMAL HIGH (ref 0–200)
HDL: 79.4 mg/dL (ref >39.00)
LDL Cholesterol: 173 mg/dL — ABNORMAL HIGH (ref 0–99)
NonHDL: 199.13
Total CHOL/HDL Ratio: 4
Triglycerides: 133 mg/dL (ref 0.0–149.0)
VLDL: 26.6 mg/dL (ref 0.0–40.0)

## 2024-05-24 LAB — IBC + FERRITIN
Ferritin: 7.2 ng/mL — ABNORMAL LOW (ref 10.0–291.0)
Iron: 42 ug/dL (ref 42–145)
Saturation Ratios: 8.7 % — ABNORMAL LOW (ref 20.0–50.0)
TIBC: 483 ug/dL — ABNORMAL HIGH (ref 250.0–450.0)
Transferrin: 345 mg/dL (ref 212.0–360.0)

## 2024-05-24 LAB — CBC WITH DIFFERENTIAL/PLATELET
Basophils Absolute: 0 K/uL (ref 0.0–0.1)
Basophils Relative: 0.3 % (ref 0.0–3.0)
Eosinophils Absolute: 0.1 K/uL (ref 0.0–0.7)
Eosinophils Relative: 1.6 % (ref 0.0–5.0)
HCT: 39.4 % (ref 36.0–46.0)
Hemoglobin: 12.8 g/dL (ref 12.0–15.0)
Lymphocytes Relative: 31.4 % (ref 12.0–46.0)
Lymphs Abs: 1.6 K/uL (ref 0.7–4.0)
MCHC: 32.4 g/dL (ref 30.0–36.0)
MCV: 87.2 fl (ref 78.0–100.0)
Monocytes Absolute: 0.4 K/uL (ref 0.1–1.0)
Monocytes Relative: 7.2 % (ref 3.0–12.0)
Neutro Abs: 3 K/uL (ref 1.4–7.7)
Neutrophils Relative %: 59.5 % (ref 43.0–77.0)
Platelets: 194 K/uL (ref 150.0–400.0)
RBC: 4.52 Mil/uL (ref 3.87–5.11)
RDW: 14.3 % (ref 11.5–15.5)
WBC: 5 K/uL (ref 4.0–10.5)

## 2024-05-24 LAB — TSH: TSH: 3.32 u[IU]/mL (ref 0.35–5.50)

## 2024-05-25 ENCOUNTER — Ambulatory Visit: Payer: Self-pay | Admitting: Internal Medicine

## 2024-05-28 ENCOUNTER — Encounter: Payer: Self-pay | Admitting: Gastroenterology

## 2024-05-29 ENCOUNTER — Encounter: Payer: Self-pay | Admitting: Gastroenterology

## 2024-05-30 ENCOUNTER — Ambulatory Visit
Admission: RE | Admit: 2024-05-30 | Discharge: 2024-05-30 | Disposition: A | Discharge status: Home / Self Care | Payer: Self-pay | Source: Ambulatory Visit | Attending: Student in an Organized Health Care Education/Training Program | Admitting: Student in an Organized Health Care Education/Training Program

## 2024-05-30 ENCOUNTER — Telehealth: Payer: Self-pay | Admitting: Internal Medicine

## 2024-05-30 DIAGNOSIS — R911 Solitary pulmonary nodule: Secondary | ICD-10-CM | POA: Insufficient documentation

## 2024-05-30 DIAGNOSIS — R918 Other nonspecific abnormal finding of lung field: Secondary | ICD-10-CM | POA: Diagnosis not present

## 2024-05-30 NOTE — Telephone Encounter (Signed)
 Lab order needed

## 2024-05-31 NOTE — Telephone Encounter (Signed)
 Michelle Roach just had fasting labs on 05/24/24. She does not need the 06/08/24 lab appt. Please call her and let her know and then forward to lab so that they are aware.  Thanks.

## 2024-05-31 NOTE — Telephone Encounter (Signed)
Lab appt canceled °

## 2024-06-08 ENCOUNTER — Other Ambulatory Visit: Payer: Self-pay

## 2024-06-11 ENCOUNTER — Ambulatory Visit: Attending: Internal Medicine

## 2024-06-11 ENCOUNTER — Ambulatory Visit: Payer: Self-pay | Admitting: Internal Medicine

## 2024-06-11 ENCOUNTER — Other Ambulatory Visit (HOSPITAL_COMMUNITY)
Admission: RE | Admit: 2024-06-11 | Discharge: 2024-06-11 | Disposition: A | Discharge status: Home / Self Care | Source: Ambulatory Visit | Attending: Internal Medicine | Admitting: Internal Medicine

## 2024-06-11 VITALS — BP 120/74 | HR 79 | Resp 16 | Ht 64.0 in | Wt 136.0 lb

## 2024-06-11 DIAGNOSIS — Z1151 Encounter for screening for human papillomavirus (HPV): Secondary | ICD-10-CM | POA: Insufficient documentation

## 2024-06-11 DIAGNOSIS — D649 Anemia, unspecified: Secondary | ICD-10-CM

## 2024-06-11 DIAGNOSIS — Z860100 Personal history of colon polyps, unspecified: Secondary | ICD-10-CM

## 2024-06-11 DIAGNOSIS — Z0000 Encounter for general adult medical examination without abnormal findings: Secondary | ICD-10-CM | POA: Diagnosis not present

## 2024-06-11 DIAGNOSIS — Z124 Encounter for screening for malignant neoplasm of cervix: Secondary | ICD-10-CM | POA: Diagnosis not present

## 2024-06-11 DIAGNOSIS — Z01419 Encounter for gynecological examination (general) (routine) without abnormal findings: Secondary | ICD-10-CM | POA: Insufficient documentation

## 2024-06-11 DIAGNOSIS — E039 Hypothyroidism, unspecified: Secondary | ICD-10-CM

## 2024-06-11 DIAGNOSIS — E7800 Pure hypercholesterolemia, unspecified: Secondary | ICD-10-CM | POA: Diagnosis not present

## 2024-06-11 DIAGNOSIS — R911 Solitary pulmonary nodule: Secondary | ICD-10-CM | POA: Diagnosis not present

## 2024-06-11 DIAGNOSIS — R002 Palpitations: Secondary | ICD-10-CM | POA: Diagnosis not present

## 2024-06-11 DIAGNOSIS — Z1231 Encounter for screening mammogram for malignant neoplasm of breast: Secondary | ICD-10-CM | POA: Diagnosis not present

## 2024-06-11 NOTE — Progress Notes (Signed)
 Subjective:    Patient ID: Michelle Roach, female    DOB: 05-17-1961, 63 y.o.   MRN: [id number]  Patient here for  Chief Complaint  Patient presents with   Annual Exam    HPI Here for a physical exam. Recently evaluated UC 05/17/24 - intermittent palpitations, dizziness, fatigue and DOE. Also, syncopal episode after donating blood. Reports has noticed, when she is lying on her side, fluttering sensation. May last 10 minutes. Discussed the need to continue decreased caffeine intake. She was evaluated by GI 02/20/24 - intermittent dark, tarry stools. Recommended f/u  EGD and colonoscopy 05/29/24. Taking iron supplements now. Had f/u  CT scan 05/30/24 - stable 4mm left lower lobe nodule. Also noted several additional 3-12mm nodules. Radiology recommended f/u CT 12 months.    Past Medical History:  Diagnosis Date   Cancer [facility])    History of colon polyps    Melanoma in situ (HCC)    right leg   Thyroid  disease    Vertigo    saw MD for this 08-12-20- cleared up the next day   Past Surgical History:  Procedure Laterality Date   COLONOSCOPY  2016   TONSILLECTOMY  1966   TUBAL LIGATION  1994   Family History  Problem Relation Age of Onset   Hypertension Mother    Diabetes Mother    Cancer Father        colon   Colon cancer Father    Esophageal cancer Father    Diabetes Maternal Grandmother    Breast cancer Neg Hx    Rectal cancer Neg Hx    Stomach cancer Neg Hx    Social History   Socioeconomic History   Marital status: Married    Spouse name: Not on file   Number of children: Not on file   Years of education: Not on file   Highest education level: Not on file  Occupational History   Not on file  Tobacco Use   Smoking status: Never   Smokeless tobacco: Never  Vaping Use   Vaping status: Never Used  Substance and Sexual Activity   Alcohol use: Yes    Alcohol/week: 0.0 standard drinks of alcohol    Comment: rarely   Drug use: No   Sexual activity: Not on file  Other  Topics Concern   Not on file  Social History Narrative   Not on file   Social Drivers of Health   Financial Resource Strain: Not on file  Food Insecurity: Not on file  Transportation Needs: Not on file  Physical Activity: Not on file  Stress: Not on file  Social Connections: Not on file     Review of Systems  Constitutional:  Negative for appetite change and unexpected weight change.  HENT:  Negative for congestion, sinus pressure and sore throat.   Eyes:  Negative for pain and visual disturbance.  Respiratory:  Negative for cough, chest tightness and shortness of breath.   Cardiovascular:  Negative for chest pain and leg swelling.       Occasional fluttering/palpitations as outlined.   Gastrointestinal:  Negative for abdominal pain, diarrhea, nausea and vomiting.  Genitourinary:  Negative for difficulty urinating and dysuria.  Musculoskeletal:  Negative for joint swelling and myalgias.  Skin:  Negative for color change and rash.  Neurological:  Negative for dizziness and headaches.  Hematological:  Negative for adenopathy. Does not bruise/bleed easily.  Psychiatric/Behavioral:  Negative for agitation and dysphoric mood.  Objective:     BP 120/74   Pulse 79   Resp 16   Ht 5' 4 (1.626 m)   Wt 136 lb (61.7 kg)   LMP 02/02/2009   SpO2 98%   BMI 23.34 kg/m  Wt Readings from Last 3 Encounters:  06/11/24 136 lb (61.7 kg)  02/20/24 137 lb 6 oz (62.3 kg)  07/28/23 134 lb 6.4 oz (61 kg)    Physical Exam Vitals reviewed.  Constitutional:      General: She is not in acute distress.    Appearance: Normal appearance. She is well-developed.  HENT:     Head: Normocephalic and atraumatic.     Right Ear: External ear normal.     Left Ear: External ear normal.     Mouth/Throat:     Pharynx: No oropharyngeal exudate or posterior oropharyngeal erythema.  Eyes:     General: No scleral icterus.       Right eye: No discharge.        Left eye: No discharge.      Conjunctiva/sclera: Conjunctivae normal.  Neck:     Thyroid : No thyromegaly.  Cardiovascular:     Rate and Rhythm: Normal rate and regular rhythm.  Pulmonary:     Effort: No tachypnea, accessory muscle usage or respiratory distress.     Breath sounds: Normal breath sounds. No decreased breath sounds, wheezing or rhonchi.  Chest:  Breasts:    Right: No inverted nipple, mass, nipple discharge or tenderness (no axillary adenopathy).     Left: No inverted nipple, mass, nipple discharge or tenderness (no axilarry adenopathy).  Abdominal:     General: Bowel sounds are normal.     Palpations: Abdomen is soft.     Tenderness: There is no abdominal tenderness.  Genitourinary:    Comments: Normal external genitalia.  Vaginal vault without lesions.  Cervix identified.  Pap smear performed.  Could not appreciate any adnexal masses or tenderness.   Musculoskeletal:        General: No swelling or tenderness.     Cervical back: Neck supple.  Lymphadenopathy:     Cervical: No cervical adenopathy.  Skin:    General: Skin is warm.     Findings: No erythema or rash.  Neurological:     Mental Status: She is alert and oriented to person, place, and time.  Psychiatric:        Mood and Affect: Mood normal.        Behavior: Behavior normal.         Outpatient Encounter Medications as of 06/11/2024  Medication Sig   ferrous sulfate 325 (65 FE) MG tablet Take 325 mg by mouth daily.   levothyroxine  (SYNTHROID ) 50 MCG tablet TAKE 1 TABLET BY MOUTH ONCE DAILY BEFORE BREAKFAST   MAGNESIUM GLUCONATE PO Take 2 capsules by mouth daily in the afternoon.   Multiple Vitamins-Minerals (PRESERVISION AREDS PO) Take by mouth daily.   No facility-administered encounter medications on file as of 06/11/2024.     Lab Results  Component Value Date   WBC 5.0 05/24/2024   HGB 12.8 05/24/2024   HCT 39.4 05/24/2024   PLT 194.0 05/24/2024   GLUCOSE 92 05/24/2024   CHOL 279 (H) 05/24/2024   TRIG 133.0 05/24/2024    HDL 79.40 05/24/2024   LDLDIRECT 147.0 06/08/2013   LDLCALC 173 (H) 05/24/2024   ALT 8 05/24/2024   AST 13 05/24/2024   NA 139 05/24/2024   K 4.4 05/24/2024   CL 106 05/24/2024  CREATININE 0.92 05/24/2024   BUN 17 05/24/2024   CO2 29 05/24/2024   TSH 3.32 05/24/2024   INR 1.0 08/10/2013    CT CHEST WO CONTRAST Result Date: 06/05/2024 CLINICAL DATA:  Follow-up for perifissural left lower lobe nodule on prior imaging. EXAM: CT CHEST WITHOUT CONTRAST TECHNIQUE: Multidetector CT imaging of the chest was performed following the standard protocol without IV contrast. RADIATION DOSE REDUCTION: This exam was performed according to the departmental dose-optimization program which includes automated exposure control, adjustment of the mA and/or kV according to patient size and/or use of iterative reconstruction technique. COMPARISON:  Partial chest CT without contrast for coronary artery calcium scoring 06/29/2023. FINDINGS: Cardiovascular: The cardiac size is normal. There are no visible coronary calcifications, no pericardial effusion. The aorta is normal in course and caliber without calcifications. The great vessels are unremarkable without contrast. Pulmonary arteries and veins are normal caliber. Mediastinum/Nodes: No enlarged mediastinal or axillary lymph nodes. Thyroid  gland, trachea, and esophagus demonstrate no significant findings. Lungs/Pleura: Stable 4 mm perifissural left lower lobe nodule on 3:68. There is a stable 3 mm fissural nodule on 3:73. Above the plane of the prior study, there is a 2 mm left upper lobe nodule anteriorly on image 40, 3 mm left upper lobe nodule on image 49, lingular nodule measuring 3 mm on image 53, 2 mm right upper lobe perihilar nodule on image 56. There is no consolidation, effusion, pneumothorax or further nodules. Upper Abdomen: No acute abnormality. Musculoskeletal: There is osteopenia and mild thoracic kyphosis and degenerative change. No acute or significant  osseous findings. No mass in the visualized chest wall. IMPRESSION: 1. Stable 4 mm perifissural left lower lobe nodule. 2. There are several additional 2 to 3 mm nodules above the plane of the prior study. No follow-up needed if patient is low-risk (and has no known or suspected primary neoplasm). Non-contrast chest CT can be considered in 12 months if patient is high-risk. This recommendation follows the consensus statement: Guidelines for Management of Incidental Pulmonary Nodules Detected on CT Images: From the Fleischner Society 2017; Radiology 2017; 284:228-243. 3. No acute chest findings or infiltrates. 4. Osteopenia and degenerative change. 5. No visible aortic calcifications. Electronically Signed   By: Francis Quam M.D.   On: 06/05/2024 01:02       Assessment & Plan:  Routine general medical examination at a health care facility  Health care maintenance Assessment & Plan: Physical today 06/11/24.  Mammogram 04/27/23 - birads I.  Due f/u mammogram. Colonoscopy 08/2020 - diverticulosis and internal hemorrhoids.  PAP 06/09/21 - negative with negative HPV. Pap today.    Screening for cervical cancer -     Cytology - PAP  Visit for screening mammogram -     3D Screening Mammogram, Left and Right; Future  Palpitations Assessment & Plan: Recently evaluated UC 05/17/24 - intermittent palpitations, dizziness, fatigue and DOE. Also, syncopal episode after donating blood. Reports has noticed, when she is lying on her side, fluttering sensation. May last 10 minutes. Discussed the need to continue decreased caffeine intake. Place zio monitor. Follow cbc.   Orders: -     LONG TERM MONITOR (3-14 DAYS); Future  Anemia, unspecified type Assessment & Plan: Continue to hold on donating blood. Follow cbc.   History of colonic polyps Assessment & Plan: Colonoscopy 08/21/20 - diverticulosis and internal hemorrhoids    Hypercholesterolemia Assessment & Plan: The 10-year ASCVD risk score (Arnett  DK, et al., 2019) is: 5%   Values used to calculate the  score:     Age: 45 years     Clincally relevant sex: Female     Is Non-Hispanic African American: No     Diabetic: No     Tobacco smoker: No     Systolic Blood Pressure: 120 mmHg     Is BP treated: No     HDL Cholesterol: 79.4 mg/dL     Total Cholesterol: 279 mg/dL  Low cholesterol diet and exercise.  Follow lipid panel.    Hypothyroidism, unspecified type Assessment & Plan: On thyroid  replacement. Follow tsh.    Lung nodule Assessment & Plan: Had f/u  CT scan 05/30/24 - stable 4mm left lower lobe nodule. Also noted several additional 3-29mm nodules. Radiology recommended f/u CT 12 months.       Allena Hamilton, MD

## 2024-06-11 NOTE — Assessment & Plan Note (Addendum)
 Physical today 06/11/24.  Mammogram 04/27/23 - birads I.  Due f/u mammogram. Colonoscopy 08/2020 - diverticulosis and internal hemorrhoids.  PAP 06/09/21 - negative with negative HPV. Pap today.

## 2024-06-12 DIAGNOSIS — Z86006 Personal history of melanoma in-situ: Secondary | ICD-10-CM | POA: Diagnosis not present

## 2024-06-12 DIAGNOSIS — Z85820 Personal history of malignant melanoma of skin: Secondary | ICD-10-CM | POA: Diagnosis not present

## 2024-06-12 DIAGNOSIS — D2261 Melanocytic nevi of right upper limb, including shoulder: Secondary | ICD-10-CM | POA: Diagnosis not present

## 2024-06-12 DIAGNOSIS — D2272 Melanocytic nevi of left lower limb, including hip: Secondary | ICD-10-CM | POA: Diagnosis not present

## 2024-06-12 DIAGNOSIS — D2262 Melanocytic nevi of left upper limb, including shoulder: Secondary | ICD-10-CM | POA: Diagnosis not present

## 2024-06-12 DIAGNOSIS — D2271 Melanocytic nevi of right lower limb, including hip: Secondary | ICD-10-CM | POA: Diagnosis not present

## 2024-06-13 ENCOUNTER — Telehealth: Payer: Self-pay | Admitting: Internal Medicine

## 2024-06-13 DIAGNOSIS — E611 Iron deficiency: Secondary | ICD-10-CM

## 2024-06-13 NOTE — Telephone Encounter (Signed)
Orders placed for f/u labs.  

## 2024-06-13 NOTE — Telephone Encounter (Signed)
 Lab order needed

## 2024-06-16 ENCOUNTER — Encounter: Payer: Self-pay | Admitting: Internal Medicine

## 2024-06-16 NOTE — Assessment & Plan Note (Signed)
 Recently evaluated UC 05/17/24 - intermittent palpitations, dizziness, fatigue and DOE. Also, syncopal episode after donating blood. Reports has noticed, when she is lying on her side, fluttering sensation. May last 10 minutes. Discussed the need to continue decreased caffeine intake. Place zio monitor. Follow cbc.

## 2024-06-16 NOTE — Assessment & Plan Note (Signed)
Colonoscopy 08/21/20 - diverticulosis and internal hemorrhoids

## 2024-06-16 NOTE — Assessment & Plan Note (Signed)
 On thyroid replacement.  Follow tsh.

## 2024-06-16 NOTE — Assessment & Plan Note (Signed)
 The 10-year ASCVD risk score (Arnett DK, et al., 2019) is: 5%   Values used to calculate the score:     Age: 63 years     Clincally relevant sex: Female     Is Non-Hispanic African American: No     Diabetic: No     Tobacco smoker: No     Systolic Blood Pressure: 120 mmHg     Is BP treated: No     HDL Cholesterol: 79.4 mg/dL     Total Cholesterol: 279 mg/dL  Low cholesterol diet and exercise.  Follow lipid panel.

## 2024-06-16 NOTE — Assessment & Plan Note (Signed)
 Continue to hold on donating blood. Follow cbc.

## 2024-06-16 NOTE — Assessment & Plan Note (Signed)
 Had f/u  CT scan 05/30/24 - stable 4mm left lower lobe nodule. Also noted several additional 3-73mm nodules. Radiology recommended f/u CT 12 months.

## 2024-06-17 ENCOUNTER — Ambulatory Visit: Payer: Self-pay | Admitting: Internal Medicine

## 2024-06-17 LAB — CYTOLOGY - PAP
Comment: NEGATIVE
Diagnosis: NEGATIVE
High risk HPV: NEGATIVE

## 2024-06-22 ENCOUNTER — Other Ambulatory Visit

## 2024-06-22 ENCOUNTER — Ambulatory Visit: Admitting: *Deleted

## 2024-06-22 ENCOUNTER — Telehealth: Payer: Self-pay | Admitting: *Deleted

## 2024-06-22 ENCOUNTER — Encounter: Payer: Self-pay | Admitting: Gastroenterology

## 2024-06-22 VITALS — Ht 64.0 in | Wt 135.0 lb

## 2024-06-22 DIAGNOSIS — E611 Iron deficiency: Secondary | ICD-10-CM

## 2024-06-22 DIAGNOSIS — D500 Iron deficiency anemia secondary to blood loss (chronic): Secondary | ICD-10-CM

## 2024-06-22 LAB — CBC WITH DIFFERENTIAL/PLATELET
Basophils Absolute: 0 K/uL (ref 0.0–0.1)
Basophils Relative: 0.5 % (ref 0.0–3.0)
Eosinophils Absolute: 0 K/uL (ref 0.0–0.7)
Eosinophils Relative: 0.8 % (ref 0.0–5.0)
HCT: 42 % (ref 36.0–46.0)
Hemoglobin: 13.7 g/dL (ref 12.0–15.0)
Lymphocytes Relative: 25.3 % (ref 12.0–46.0)
Lymphs Abs: 1.2 K/uL (ref 0.7–4.0)
MCHC: 32.7 g/dL (ref 30.0–36.0)
MCV: 87.7 fl (ref 78.0–100.0)
Monocytes Absolute: 0.4 K/uL (ref 0.1–1.0)
Monocytes Relative: 7.1 % (ref 3.0–12.0)
Neutro Abs: 3.3 K/uL (ref 1.4–7.7)
Neutrophils Relative %: 66.3 % (ref 43.0–77.0)
Platelets: 171 K/uL (ref 150.0–400.0)
RBC: 4.79 Mil/uL (ref 3.87–5.11)
RDW: 15.8 % — ABNORMAL HIGH (ref 11.5–15.5)
WBC: 4.9 K/uL (ref 4.0–10.5)

## 2024-06-22 LAB — IBC + FERRITIN
Ferritin: 26.8 ng/mL (ref 10.0–291.0)
Iron: 145 ug/dL (ref 42–145)
Saturation Ratios: 36.1 % (ref 20.0–50.0)
TIBC: 401.8 ug/dL (ref 250.0–450.0)
Transferrin: 287 mg/dL (ref 212.0–360.0)

## 2024-06-22 NOTE — Telephone Encounter (Signed)
 Patient reached on follow up attempt.  PV completed.

## 2024-06-22 NOTE — Progress Notes (Signed)
 Pre visit completed over telephone. Patient was a reschedule from July and confirmed no changes in health or medications  Patient already has SUTAB  from previously cancelled colonoscopy.    No egg or soy allergy known to patient  No issues known to pt with past sedation with any surgeries or procedures Patient denies ever being told they had issues or difficulty with intubation  No FH of Malignant Hyperthermia Pt is not on diet pills Pt is not on  home 02  Pt is not on blood thinners  Pt denies issues with constipation  No A fib or A flutter Have any cardiac testing pending-- NO Pt instructed to use [email or web address].[email or web address] or GoodRx for a price reduction on prep    Chart reviewed by DOROTHA Schillings, CRNA

## 2024-06-23 ENCOUNTER — Ambulatory Visit: Payer: Self-pay | Admitting: Internal Medicine

## 2024-07-02 ENCOUNTER — Encounter: Payer: Self-pay | Admitting: Gastroenterology

## 2024-07-02 ENCOUNTER — Ambulatory Visit (AMBULATORY_SURGERY_CENTER): Payer: Self-pay | Admitting: Gastroenterology

## 2024-07-02 VITALS — BP 107/69 | HR 57 | Temp 97.9°F | Resp 15 | Ht 64.0 in | Wt 135.0 lb

## 2024-07-02 DIAGNOSIS — D509 Iron deficiency anemia, unspecified: Secondary | ICD-10-CM

## 2024-07-02 DIAGNOSIS — K644 Residual hemorrhoidal skin tags: Secondary | ICD-10-CM | POA: Diagnosis not present

## 2024-07-02 DIAGNOSIS — K259 Gastric ulcer, unspecified as acute or chronic, without hemorrhage or perforation: Secondary | ICD-10-CM | POA: Diagnosis not present

## 2024-07-02 DIAGNOSIS — K2970 Gastritis, unspecified, without bleeding: Secondary | ICD-10-CM

## 2024-07-02 DIAGNOSIS — K648 Other hemorrhoids: Secondary | ICD-10-CM

## 2024-07-02 DIAGNOSIS — D500 Iron deficiency anemia secondary to blood loss (chronic): Secondary | ICD-10-CM

## 2024-07-02 DIAGNOSIS — K209 Esophagitis, unspecified without bleeding: Secondary | ICD-10-CM | POA: Diagnosis not present

## 2024-07-02 MED ORDER — SODIUM CHLORIDE 0.9 % IV SOLN: 500.0000 mL | Freq: Once | INTRAVENOUS | Status: DC

## 2024-07-02 MED ORDER — PANTOPRAZOLE SODIUM 40 MG PO TBEC: 40.0000 mg | DELAYED_RELEASE_TABLET | Freq: Every day | ORAL | 0 refills | Status: AC

## 2024-07-02 NOTE — Progress Notes (Unsigned)
 [facility] History and Physical   Primary Care Physician:  Glendia Shad, MD   Reason for Procedure:  Iron deficiency anemia  Plan:    EGD and colonoscopy with possible interventions as needed     HPI: Michelle Roach is a very pleasant 63 y.o. female here for EGD and colonoscopy for iron deficiency anemia.   The risks and benefits as well as alternatives of endoscopic procedure(s) have been discussed and reviewed. All questions answered. The patient agrees to proceed.    Past Medical History:  Diagnosis Date   Cancer [facility])    History of colon polyps    Melanoma in situ (HCC)    right leg   Thyroid  disease    Vertigo    saw MD for this 08-12-20- cleared up the next day    Past Surgical History:  Procedure Laterality Date   COLONOSCOPY  2016   TONSILLECTOMY  1966   TUBAL LIGATION  1994    Prior to Admission medications   Medication Sig Start Date End Date Taking? Authorizing Provider  ferrous sulfate 325 (65 FE) MG tablet Take 325 mg by mouth daily.   Yes [provider]  levothyroxine  (SYNTHROID ) 50 MCG tablet TAKE 1 TABLET BY MOUTH ONCE DAILY BEFORE BREAKFAST 02/23/24  Yes Glendia Shad, MD  MAGNESIUM GLUCONATE PO Take 2 capsules by mouth daily in the afternoon. Patient not taking: No sig reported    [provider]  Multiple Vitamins-Minerals (PRESERVISION AREDS PO) Take by mouth daily. Patient not taking: No sig reported    [provider]    Current Outpatient Medications  Medication Sig Dispense Refill   ferrous sulfate 325 (65 FE) MG tablet Take 325 mg by mouth daily.     levothyroxine  (SYNTHROID ) 50 MCG tablet TAKE 1 TABLET BY MOUTH ONCE DAILY BEFORE BREAKFAST 90 tablet 1   MAGNESIUM GLUCONATE PO Take 2 capsules by mouth daily in the afternoon. (Patient not taking: No sig reported)     Multiple Vitamins-Minerals (PRESERVISION AREDS PO) Take by mouth daily. (Patient not taking: No sig reported)     Current  Facility-Administered Medications  Medication Dose Route Frequency Provider Last Rate Last Admin   0.9 %  sodium chloride  infusion  500 mL Intravenous Once Demorris Choyce V, MD        Allergies as of 07/02/2024   (No Known Allergies)    Family History  Problem Relation Age of Onset   Hypertension Mother    Diabetes Mother    Cancer Father        colon   Colon cancer Father    Diabetes Maternal Grandmother    Breast cancer Neg Hx    Rectal cancer Neg Hx    Stomach cancer Neg Hx     Social History   Socioeconomic History   Marital status: Married    Spouse name: Not on file   Number of children: Not on file   Years of education: Not on file   Highest education level: Not on file  Occupational History   Not on file  Tobacco Use   Smoking status: Never   Smokeless tobacco: Never  Vaping Use   Vaping status: Never Used  Substance and Sexual Activity   Alcohol use: Yes    Alcohol/week: 0.0 standard drinks of alcohol    Comment: rarely   Drug use: No   Sexual activity: Not on file  Other Topics Concern   Not on file  Social History Narrative  Not on file   Social Drivers of Health   Financial Resource Strain: Not on file  Food Insecurity: Not on file  Transportation Needs: Not on file  Physical Activity: Not on file  Stress: Not on file  Social Connections: Not on file  Intimate Partner Violence: Not on file    Review of Systems:  All other review of systems negative except as mentioned in the HPI.  Physical Exam: Vital signs in last 24 hours: BP (!) 132/91   Pulse (!) 59   Temp 97.9 F (36.6 C) (Temporal)   Ht 5' 4 (1.626 m)   Wt 135 lb (61.2 kg)   LMP 02/02/2009   SpO2 99%   BMI 23.17 kg/m  General:   Alert, NAD Lungs:  Clear .   Heart:  Regular rate and rhythm Abdomen:  Soft, nontender and nondistended. Neuro/Psych:  Alert and cooperative. Normal mood and affect. A and O x 3  Reviewed labs, radiology imaging, old records and pertinent  past GI work up  Patient is appropriate for planned procedure(s) and anesthesia in an ambulatory setting   K. Veena Tracee Mccreery , MD [phone]

## 2024-07-02 NOTE — Op Note (Signed)
 [facility] Patient Name: Michelle Roach Procedure Date: 07/02/2024 1:44 PM MRN: [id number] Endoscopist: Gustav ALONSO Mcgee , MD, [id number] Age: 63 Referring MD:  Date of Birth: [birth year] Gender: Female Account #: [id number] Procedure:                Upper GI endoscopy Indications:              Iron deficiency anemia due to suspected upper                            gastrointestinal bleeding, Suspected upper                            gastrointestinal bleeding in patient with                            unexplained iron deficiency anemia Medicines:                Monitored Anesthesia Care Procedure:                Pre-Anesthesia Assessment:                           - Prior to the procedure, a History and Physical                            was performed, and patient medications and                            allergies were reviewed. The patient's tolerance of                            previous anesthesia was also reviewed. The risks                            and benefits of the procedure and the sedation                            options and risks were discussed with the patient.                            All questions were answered, and informed consent                            was obtained. Prior Anticoagulants: The patient has                            taken no anticoagulant or antiplatelet agents. ASA                            Grade Assessment: II - A patient with mild systemic                            disease. After reviewing the risks and benefits,  the patient was deemed in satisfactory condition to                            undergo the procedure.                           After obtaining informed consent, the endoscope was                            passed under direct vision. Throughout the                            procedure, the patient's blood pressure, pulse, and                            oxygen saturations were  monitored continuously. The                            GIF [id number] #[id number] was introduced through the                            mouth, and advanced to the second part of duodenum.                            The upper GI endoscopy was accomplished without                            difficulty. The patient tolerated the procedure                            well. Scope In: Scope Out: Findings:                 The Z-line was regular and was found 35 cm from the                            incisors.                           LA Grade B (one or more mucosal breaks greater than                            5 mm, not extending between the tops of two mucosal                            folds) esophagitis with no bleeding was found 34 to                            35 cm from the incisors.                           Patchy mild inflammation with hemorrhage                            characterized by adherent blood,  congestion                            (edema), erosions, erythema and friability was                            found in the entire examined stomach. Biopsies were                            taken with a cold forceps for Helicobacter pylori                            testing.                           The cardia and gastric fundus were normal on                            retroflexion.                           The examined duodenum was normal. Complications:            No immediate complications. Estimated Blood Loss:     Estimated blood loss was minimal. Impression:               - Z-line regular, 35 cm from the incisors.                           - LA Grade B reflux esophagitis with no bleeding.                           - Erosive gastritis with hemorrhage. Biopsied.                           - Normal examined duodenum. Recommendation:           - Patient has a contact number available for                            emergencies. The signs and symptoms of potential                             delayed complications were discussed with the                            patient. Return to normal activities tomorrow.                            Written discharge instructions were provided to the                            patient.                           - Resume previous diet.                           -  Continue present medications.                           - Await pathology results.                           - Use Protonix  (pantoprazole ) 40 mg PO daily. Rx                            for 90 days Amel Gianino V. Elyssa Pendelton, MD 07/02/2024 2:39:42 PM This report has been signed electronically.

## 2024-07-02 NOTE — Patient Instructions (Addendum)
 Resume previous diet  Continue present medications. Use Protonix  (pantoprazole ) 40mg  daily.  Await pathology results  Repeat colonoscopy in 10 years for surveillance See handout on gastritis  YOU HAD AN ENDOSCOPIC PROCEDURE TODAY AT THE [facility]:   Refer to the procedure report that was given to you for any specific questions about what was found during the examination.  If the procedure report does not answer your questions, please call your gastroenterologist to clarify.  If you requested that your care partner not be given the details of your procedure findings, then the procedure report has been included in a sealed envelope for you to review at your convenience later.  YOU SHOULD EXPECT: Some feelings of bloating in the abdomen. Passage of more gas than usual.  Walking can help get rid of the air that was put into your GI tract during the procedure and reduce the bloating. If you had a lower endoscopy (such as a colonoscopy or flexible sigmoidoscopy) you may notice spotting of blood in your stool or on the toilet paper. If you underwent a bowel prep for your procedure, you may not have a normal bowel movement for a few days.  Please Note:  You might notice some irritation and congestion in your nose or some drainage.  This is from the oxygen used during your procedure.  There is no need for concern and it should clear up in a day or so.  SYMPTOMS TO REPORT IMMEDIATELY: Following lower endoscopy (colonoscopy or flexible sigmoidoscopy):  Excessive amounts of blood in the stool  Significant tenderness or worsening of abdominal pains  Swelling of the abdomen that is new, acute  Fever of 100F or higher  Following upper endoscopy (EGD)  Vomiting of blood or coffee ground material  New chest pain or pain under the shoulder blades  Painful or persistently difficult swallowing  New shortness of breath  Black, tarry-looking stools  For urgent or emergent issues, a  gastroenterologist can be reached at any hour by calling [phone]. Do not use MyChart messaging for urgent concerns.   DIET:  We do recommend a small meal at first, but then you may proceed to your regular diet.  Drink plenty of fluids but you should avoid alcoholic beverages for 24 hours.  ACTIVITY:  You should plan to take it easy for the rest of today and you should NOT DRIVE or use heavy machinery until tomorrow (because of the sedation medicines used during the test).    FOLLOW UP: Our staff will call the number listed on your records the next business day following your procedure.  We will call around 7:15- 8:00 am to check on you and address any questions or concerns that you may have regarding the information given to you following your procedure. If we do not reach you, we will leave a message.     If any biopsies were taken you will be contacted by phone or by letter within the next 1-3 weeks.  Please call us  at [phone] if you have not heard about the biopsies in 3 weeks.   SIGNATURES/CONFIDENTIALITY: You and/or your care partner have signed paperwork which will be entered into your electronic medical record.  These signatures attest to the fact that that the information above on your After Visit Summary has been reviewed and is understood.  Full responsibility of the confidentiality of this discharge information lies with you and/or your care-partner.

## 2024-07-02 NOTE — Op Note (Signed)
 [facility] Patient Name: Michelle Roach Procedure Date: 07/02/2024 1:42 PM MRN: [id number] Endoscopist: Gustav ALONSO Mcgee , MD, [id number] Age: 63 Referring MD:  Date of Birth: 10/17/1961 Gender: Female Account #: [id number] Procedure:                Colonoscopy Indications:              Evaluation of unexplained GI bleeding presenting                            with fecal occult blood, Unexplained iron                            deficiency anemia Medicines:                Monitored Anesthesia Care Procedure:                Pre-Anesthesia Assessment:                           - Prior to the procedure, a History and Physical                            was performed, and patient medications and                            allergies were reviewed. The patient's tolerance of                            previous anesthesia was also reviewed. The risks                            and benefits of the procedure and the sedation                            options and risks were discussed with the patient.                            All questions were answered, and informed consent                            was obtained. Prior Anticoagulants: The patient has                            taken no anticoagulant or antiplatelet agents. ASA                            Grade Assessment: II - A patient with mild systemic                            disease. After reviewing the risks and benefits,                            the patient was deemed in satisfactory condition to  undergo the procedure.                           After obtaining informed consent, the colonoscope                            was passed under direct vision. Throughout the                            procedure, the patient's blood pressure, pulse, and                            oxygen saturations were monitored continuously. The                            Olympus Scope SN [id number] was introduced  through the                            anus and advanced to the the cecum, identified by                            appendiceal orifice and ileocecal valve. The                            colonoscopy was performed without difficulty. The                            patient tolerated the procedure well. The quality                            of the bowel preparation was adequate to identify                            polyps greater than 5 mm in size. The ileocecal                            valve, appendiceal orifice, and rectum were                            photographed. Scope In: 2:01:47 PM Scope Out: 2:30:11 PM Scope Withdrawal Time: 0 hours 7 minutes 3 seconds  Total Procedure Duration: 0 hours 28 minutes 24 seconds  Findings:                 The perianal and digital rectal examinations were                            normal.                           Non-bleeding external and internal hemorrhoids were                            found during retroflexion. The hemorrhoids were  small.                           The exam was otherwise without abnormality. Complications:            No immediate complications. Estimated Blood Loss:     Estimated blood loss: none. Impression:               - Non-bleeding external and internal hemorrhoids.                           - The examination was otherwise normal.                           - No specimens collected. Recommendation:           - Resume previous diet.                           - Continue present medications.                           - Repeat colonoscopy in 10 years for surveillance. Bricen Victory V. Lashana Spang, MD 07/02/2024 2:36:01 PM This report has been signed electronically.

## 2024-07-02 NOTE — Progress Notes (Unsigned)
 Report given to PACU, vss

## 2024-07-02 NOTE — Progress Notes (Unsigned)
 Called to room to assist during endoscopic procedure.  Patient ID and intended procedure confirmed with present staff. Received instructions for my participation in the procedure from the performing physician.

## 2024-07-02 NOTE — Progress Notes (Unsigned)
 1348 Robinul 0.1 mg IV given due large amount of secretions upon assessment.  MD made aware, vss

## 2024-07-03 ENCOUNTER — Telehealth: Payer: Self-pay

## 2024-07-03 NOTE — Telephone Encounter (Signed)
  Follow up Call-     07/02/2024   12:57 PM  Call back number  Post procedure Call Back phone  # [phone]  Permission to leave phone message Yes     Patient questions:  Do you have a fever, pain , or abdominal swelling? No. Pain Score  0 *  Have you tolerated food without any problems? Yes.    Have you been able to return to your normal activities? Yes.    Do you have any questions about your discharge instructions: Diet   No. Medications  No. Follow up visit  No.  Do you have questions or concerns about your Care? No.  Actions: * If pain score is 4 or above: No action needed, pain <4.

## 2024-07-05 DIAGNOSIS — R002 Palpitations: Secondary | ICD-10-CM | POA: Diagnosis not present

## 2024-07-05 LAB — SURGICAL PATHOLOGY

## 2024-07-06 DIAGNOSIS — R002 Palpitations: Secondary | ICD-10-CM

## 2024-07-12 ENCOUNTER — Ambulatory Visit: Payer: Self-pay | Admitting: Student in an Organized Health Care Education/Training Program

## 2024-07-12 DIAGNOSIS — R911 Solitary pulmonary nodule: Secondary | ICD-10-CM

## 2024-07-13 NOTE — Telephone Encounter (Deleted)
 Michelle Roach

## 2024-07-26 ENCOUNTER — Telehealth: Payer: Self-pay

## 2024-07-26 NOTE — Telephone Encounter (Signed)
 See the telephone encounter of 07/26/24.

## 2024-07-26 NOTE — Telephone Encounter (Signed)
 Inbound call from patient calling in regards to procedure results. Please advise.

## 2024-07-26 NOTE — Telephone Encounter (Signed)
 The pt aware that report has not been reviewed and we will contact her as soon as able

## 2024-07-26 NOTE — Telephone Encounter (Signed)
 Patient calling to inquire about her results.  Called the patient back. No answer. Left message of my call. Will let her provider know she is asking for her results.

## 2024-07-27 NOTE — Telephone Encounter (Signed)
 Called and discussed results.  Patient was appreciative and did not have any concerns

## 2024-08-01 ENCOUNTER — Ambulatory Visit: Attending: Cardiovascular Disease | Admitting: Cardiovascular Disease

## 2024-08-01 ENCOUNTER — Encounter: Payer: Self-pay | Admitting: Internal Medicine

## 2024-08-01 ENCOUNTER — Encounter: Payer: Self-pay | Admitting: Cardiovascular Disease

## 2024-08-01 VITALS — BP 120/84 | HR 73 | Ht 64.0 in | Wt 136.6 lb

## 2024-08-01 DIAGNOSIS — I471 Supraventricular tachycardia, unspecified: Secondary | ICD-10-CM

## 2024-08-01 DIAGNOSIS — R002 Palpitations: Secondary | ICD-10-CM

## 2024-08-01 DIAGNOSIS — D649 Anemia, unspecified: Secondary | ICD-10-CM

## 2024-08-01 NOTE — Progress Notes (Signed)
 Cardiology Office Note  Date:  08/01/2024   ID:  Michelle Roach, DOB 1961-07-16, MRN [id number]  PCP:  Glendia Shad, MD   Chief Complaint  Patient presents with   Palpitations    Patient with complaint of irregular heart beat. Patient reports passing out a few hours after donating blood. Patient state that she had another episode of feeling her heart racing.      HPI:  Michelle Roach a 63 y.o. female with past medical history of: Hyperlipidemia Palpitations Anemia Thyroid  disease SVT Who presents by referral from Dr. Shad Glendia for syncope after donating blood, palpitations/SVT  On today's visit we discussed recent events Donated blood 6/25, following which she had episode of syncope She reports it was a hot day, was with her mom Was sitting and stood up, loss of consciousness Bruised head, woke up relatively quickly No further episodes since that time  The next week, squeezing lemon juice, got lightheded Went to  [facility] 05/17/24 - intermittent palpitations, dizziness, fatigue and DOE  Workup negative including lab work, EKG Noted to be anemic, iron deficient Similar to July 2024 Since then has been taking iron pill Iron numbers and hemoglobin have trended up since that time  Zio monitor was ordered showing HR 44 to 255, average 65. 127 nonsustained SVT, longest 19 seconds with an average rate of 209 bpm. Rare supraventricular and ventricular ectopy. No sustained arrhythmias. No atrial fibrillation. 1 SVT episode  was patient triggered, other episodes were not triggered, reports she was asymptomatic  Rare episodes of lightheadedness Active at baseline, does regular exercise class, eats healthy  EKG personally reviewed by myself on todays visit EKG Interpretation Date/Time:  Wednesday August 01 2024 10:59:27 EDT Ventricular Rate:  73 PR Interval:  148 QRS Duration:  120 QT Interval:  398 QTC Calculation: 438 R Axis:   9  Text Interpretation: Normal sinus  rhythm Possible Left atrial enlargement Low voltage QRS Right bundle branch block When compared with ECG of 17-May-2024 13:10, No significant change was found Confirmed by Perla Lye [phone]) on 08/01/2024 11:07:08 AM      PMH:   has a past medical history of Cancer (HCC), History of colon polyps, Melanoma in situ (HCC), Thyroid  disease, and Vertigo.  PSH:    Past Surgical History:  Procedure Laterality Date   COLONOSCOPY  2016   TONSILLECTOMY  1966   TUBAL LIGATION  1994    Current Outpatient Medications  Medication Sig Dispense Refill   ferrous sulfate 325 (65 FE) MG tablet Take 325 mg by mouth daily.     levothyroxine  (SYNTHROID ) 50 MCG tablet TAKE 1 TABLET BY MOUTH ONCE DAILY BEFORE BREAKFAST 90 tablet 1   MAGNESIUM GLUCONATE PO Take 2 capsules by mouth daily in the afternoon. (Patient not taking: Reported on 08/01/2024)     Multiple Vitamins-Minerals (PRESERVISION AREDS PO) Take by mouth daily. (Patient not taking: Reported on 08/01/2024)     pantoprazole  (PROTONIX ) 40 MG tablet Take 1 tablet (40 mg total) by mouth daily. (Patient not taking: Reported on 08/01/2024) 90 tablet 0   No current facility-administered medications for this visit.    Allergies:   Patient has no known allergies.   Social History:  The patient  reports that she has never smoked. She has never used smokeless tobacco. She reports current alcohol use. She reports that she does not use drugs.   Family History:   family history includes Cancer in her father; Colon cancer in her father; Diabetes in her maternal  grandmother; Hypertension in her mother.    Review of Systems: Review of Systems  Constitutional: Negative.   HENT: Negative.    Respiratory: Negative.    Cardiovascular: Negative.   Gastrointestinal: Negative.   Musculoskeletal: Negative.   Neurological:  Positive for dizziness and loss of consciousness.  Psychiatric/Behavioral: Negative.    All other systems reviewed and are  negative.   PHYSICAL EXAM: VS:  BP 120/84 Comment: right arm  Pulse 73   Ht 5' 4 (1.626 m)   Wt 136 lb 9.6 oz (62 kg)   LMP 02/02/2009   SpO2 96%   BMI 23.45 kg/m  , BMI Body mass index is 23.45 kg/m. GEN: Well nourished, well developed, in no acute distress HEENT: normal Neck: no JVD, carotid bruits, or masses Cardiac: RRR; no murmurs, rubs, or gallops,no edema  Respiratory:  clear to auscultation bilaterally, normal work of breathing GI: soft, nontender, nondistended, + BS MS: no deformity or atrophy Skin: warm and dry, no rash Neuro:  Strength and sensation are intact Psych: euthymic mood, full affect  Recent Labs: 05/24/2024: ALT 8; BUN 17; Creatinine, Ser 0.92; Potassium 4.4; Sodium 139; TSH 3.32 06/22/2024: Hemoglobin 13.7; Platelets 171.0    Lipid Panel Lab Results  Component Value Date   CHOL 279 (H) 05/24/2024   HDL 79.40 05/24/2024   LDLCALC 173 (H) 05/24/2024   TRIG 133.0 05/24/2024      Wt Readings from Last 3 Encounters:  08/01/24 136 lb 9.6 oz (62 kg)  07/02/24 135 lb (61.2 kg)  06/22/24 135 lb (61.2 kg)     ASSESSMENT AND PLAN:  Problem List Items Addressed This Visit     Anemia   Palpitations   Relevant Orders   EKG 12-Lead (Completed)   Other Visit Diagnoses       SVT (supraventricular tachycardia) (HCC)    -  Primary   Relevant Orders   EKG 12-Lead (Completed)      Syncope In the setting of blood donation, possible dehydration, anemia, on standing had loss of consciousness, no further episodes since that time -Hemoglobin improved on iron pill/prominent Unable to definitively exclude cardiac arrhythmia, run of SVT as a contributor to her syncope For the most part is asymptomatic on Zio monitor, only 1 of 120 episodes were triggered - Commended she aggressively hydrate, given mild drop in blood pressure numbers, liberalize salt intake  SVT Over 120 short episodes on monitor for the most part is asymptomatic -She prefers not to be on  medications - She has Apple Watch, recommend she use this to monitor her symptoms and for arrhythmia using her iPhone - For worsening near-syncope associated with SVT, could consider evaluation by EP for ablation versus medical therapy  Cardiac risk factors Non-smoker, calcium score 0, no diabetes Total cholesterol elevated 279 with LDL 173 Recent trend up in numbers compared to July 2024 If medications desired could consider low-dose statin +/- Zetia    Signed, Tim Eliana Lueth, M.D., Ph.D. [facility] [place], [place] [phone]

## 2024-08-01 NOTE — Patient Instructions (Addendum)

## 2024-08-14 ENCOUNTER — Telehealth: Payer: Self-pay | Admitting: Internal Medicine

## 2024-08-14 NOTE — Telephone Encounter (Signed)
 Noted. Needs to be scheduled with Dr Glendia.

## 2024-08-14 NOTE — Telephone Encounter (Signed)
 FYI.. Called pt and left voice mail and my chart message to schedule their welcome to medicare.

## 2024-08-20 DIAGNOSIS — R208 Other disturbances of skin sensation: Secondary | ICD-10-CM | POA: Diagnosis not present

## 2024-08-20 DIAGNOSIS — L820 Inflamed seborrheic keratosis: Secondary | ICD-10-CM | POA: Diagnosis not present

## 2024-08-20 DIAGNOSIS — L728 Other follicular cysts of the skin and subcutaneous tissue: Secondary | ICD-10-CM | POA: Diagnosis not present

## 2024-08-22 ENCOUNTER — Other Ambulatory Visit: Payer: Self-pay | Admitting: Internal Medicine

## 2024-08-24 ENCOUNTER — Ambulatory Visit: Admitting: Cardiovascular Disease

## 2024-09-12 ENCOUNTER — Ambulatory Visit: Payer: Self-pay | Admitting: Gastroenterology

## 2024-09-14 ENCOUNTER — Telehealth: Payer: Self-pay | Admitting: Internal Medicine

## 2024-09-14 DIAGNOSIS — D649 Anemia, unspecified: Secondary | ICD-10-CM

## 2024-09-14 DIAGNOSIS — E7800 Pure hypercholesterolemia, unspecified: Secondary | ICD-10-CM

## 2024-09-14 DIAGNOSIS — E039 Hypothyroidism, unspecified: Secondary | ICD-10-CM

## 2024-09-14 NOTE — Telephone Encounter (Signed)
 Orders placed.

## 2024-09-14 NOTE — Telephone Encounter (Signed)
 Patient need lab orders.

## 2024-09-14 NOTE — Addendum Note (Signed)
 Addended by: BRIEN SHARENE RAMAN on: 09/14/2024 02:39 PM   Modules accepted: Orders

## 2024-09-14 NOTE — Addendum Note (Signed)
 Addended by: GLENDIA ALLENA RAMAN on: 09/14/2024 03:32 PM   Modules accepted: Orders

## 2024-09-14 NOTE — Telephone Encounter (Signed)
 Labs ordered/signed.

## 2024-09-15 ENCOUNTER — Other Ambulatory Visit: Payer: Self-pay | Admitting: Internal Medicine

## 2024-09-15 DIAGNOSIS — E611 Iron deficiency: Secondary | ICD-10-CM

## 2024-09-15 NOTE — Progress Notes (Signed)
Order placed for future lab

## 2024-09-17 ENCOUNTER — Ambulatory Visit: Payer: Self-pay | Admitting: Internal Medicine

## 2024-09-17 ENCOUNTER — Other Ambulatory Visit (INDEPENDENT_AMBULATORY_CARE_PROVIDER_SITE_OTHER)

## 2024-09-17 DIAGNOSIS — D649 Anemia, unspecified: Secondary | ICD-10-CM | POA: Diagnosis not present

## 2024-09-17 DIAGNOSIS — E039 Hypothyroidism, unspecified: Secondary | ICD-10-CM | POA: Diagnosis not present

## 2024-09-17 DIAGNOSIS — E611 Iron deficiency: Secondary | ICD-10-CM | POA: Diagnosis not present

## 2024-09-17 DIAGNOSIS — E7800 Pure hypercholesterolemia, unspecified: Secondary | ICD-10-CM

## 2024-09-17 LAB — CBC WITH DIFFERENTIAL/PLATELET
Basophils Absolute: 0 K/uL (ref 0.0–0.1)
Basophils Relative: 0.7 % (ref 0.0–3.0)
Eosinophils Absolute: 0.1 K/uL (ref 0.0–0.7)
Eosinophils Relative: 1.4 % (ref 0.0–5.0)
HCT: 42.2 % (ref 36.0–46.0)
Hemoglobin: 14.2 g/dL (ref 12.0–15.0)
Lymphocytes Relative: 33.7 % (ref 12.0–46.0)
Lymphs Abs: 1.4 K/uL (ref 0.7–4.0)
MCHC: 33.6 g/dL (ref 30.0–36.0)
MCV: 87.3 fl (ref 78.0–100.0)
Monocytes Absolute: 0.3 K/uL (ref 0.1–1.0)
Monocytes Relative: 7.2 % (ref 3.0–12.0)
Neutro Abs: 2.4 K/uL (ref 1.4–7.7)
Neutrophils Relative %: 57 % (ref 43.0–77.0)
Platelets: 164 K/uL (ref 150.0–400.0)
RBC: 4.84 Mil/uL (ref 3.87–5.11)
RDW: 15.8 % — ABNORMAL HIGH (ref 11.5–15.5)
WBC: 4.3 K/uL (ref 4.0–10.5)

## 2024-09-17 LAB — TSH: TSH: 3.12 u[IU]/mL (ref 0.35–5.50)

## 2024-09-17 LAB — HEPATIC FUNCTION PANEL
ALT: 12 U/L (ref 0–35)
AST: 16 U/L (ref 0–37)
Albumin: 4.5 g/dL (ref 3.5–5.2)
Alkaline Phosphatase: 55 U/L (ref 39–117)
Bilirubin, Direct: 0.1 mg/dL (ref 0.0–0.3)
Total Bilirubin: 0.5 mg/dL (ref 0.2–1.2)
Total Protein: 6.8 g/dL (ref 6.0–8.3)

## 2024-09-17 LAB — BASIC METABOLIC PANEL WITH GFR
BUN: 14 mg/dL (ref 6–23)
CO2: 28 meq/L (ref 19–32)
Calcium: 9.1 mg/dL (ref 8.4–10.5)
Chloride: 104 meq/L (ref 96–112)
Creatinine, Ser: 0.92 mg/dL (ref 0.40–1.20)
GFR: 65.43 mL/min (ref >60.00)
Glucose, Bld: 81 mg/dL (ref 70–99)
Potassium: 4.5 meq/L (ref 3.5–5.1)
Sodium: 139 meq/L (ref 135–145)

## 2024-09-17 LAB — LIPID PANEL
Cholesterol: 275 mg/dL — ABNORMAL HIGH (ref 0–200)
HDL: 69.9 mg/dL (ref >39.00)
LDL Cholesterol: 191 mg/dL — ABNORMAL HIGH (ref 0–99)
NonHDL: 205.41
Total CHOL/HDL Ratio: 4
Triglycerides: 70 mg/dL (ref 0.0–149.0)
VLDL: 14 mg/dL (ref 0.0–40.0)

## 2024-09-17 LAB — FERRITIN: Ferritin: 49.1 ng/mL (ref 10.0–291.0)

## 2024-09-19 ENCOUNTER — Ambulatory Visit: Admitting: Internal Medicine

## 2024-09-19 ENCOUNTER — Encounter: Payer: Self-pay | Admitting: Internal Medicine

## 2024-09-19 ENCOUNTER — Other Ambulatory Visit: Payer: Self-pay | Admitting: Internal Medicine

## 2024-09-19 VITALS — BP 110/70 | HR 64 | Temp 98.0°F | Ht 64.0 in | Wt 132.2 lb

## 2024-09-19 DIAGNOSIS — I471 Supraventricular tachycardia, unspecified: Secondary | ICD-10-CM

## 2024-09-19 DIAGNOSIS — E7800 Pure hypercholesterolemia, unspecified: Secondary | ICD-10-CM

## 2024-09-19 DIAGNOSIS — E039 Hypothyroidism, unspecified: Secondary | ICD-10-CM

## 2024-09-19 DIAGNOSIS — R911 Solitary pulmonary nodule: Secondary | ICD-10-CM | POA: Diagnosis not present

## 2024-09-19 DIAGNOSIS — Z860100 Personal history of colon polyps, unspecified: Secondary | ICD-10-CM | POA: Diagnosis not present

## 2024-09-19 DIAGNOSIS — D649 Anemia, unspecified: Secondary | ICD-10-CM | POA: Diagnosis not present

## 2024-09-19 DIAGNOSIS — Z13820 Encounter for screening for osteoporosis: Secondary | ICD-10-CM | POA: Diagnosis not present

## 2024-09-19 DIAGNOSIS — Z1231 Encounter for screening mammogram for malignant neoplasm of breast: Secondary | ICD-10-CM

## 2024-09-19 DIAGNOSIS — Z23 Encounter for immunization: Secondary | ICD-10-CM | POA: Diagnosis not present

## 2024-09-19 MED ORDER — LEVOTHYROXINE SODIUM 50 MCG PO TABS: 50.0000 ug | ORAL_TABLET | Freq: Every day | ORAL | 3 refills | Status: AC

## 2024-09-19 NOTE — Progress Notes (Signed)
 Subjective:    Patient ID: Michelle Roach, female    DOB: [birth year], 63 y.o.   MRN: [id number]  Patient here for  Chief Complaint  Patient presents with   Medical Management of Chronic Issues    3 mth f/u    HPI Here for a scheduled follow up - follow up regarding palpitations and hypercholesterolemia. Saw cardiology 08/01/24 - f/u syncope - continue to stay hydrated and liberalize salt intake. Consider low dose statin. Colonoscopy 07/02/24 - external/internal hemorrhoids. Follow up colonoscopy in 10 years. EGD - erosive gastritis with hemorrhage. Started on protonix . Tries to stay active. No chest pain or sob reported. No abdominal pain or bowel change reported.    Past Medical History:  Diagnosis Date   Cancer [facility])    History of colon polyps    Melanoma in situ (HCC)    right leg   Thyroid  disease    Vertigo    saw MD for this 08-12-20- cleared up the next day   Past Surgical History:  Procedure Laterality Date   COLONOSCOPY  2016   TONSILLECTOMY  1966   TUBAL LIGATION  1994   Family History  Problem Relation Age of Onset   Hypertension Mother    Cancer Father        colon   Colon cancer Father    Diabetes Maternal Grandmother    Breast cancer Neg Hx    Rectal cancer Neg Hx    Stomach cancer Neg Hx    Social History   Socioeconomic History   Marital status: Married    Spouse name: Not on file   Number of children: Not on file   Years of education: Not on file   Highest education level: Not on file  Occupational History   Not on file  Tobacco Use   Smoking status: Never   Smokeless tobacco: Never  Vaping Use   Vaping status: Never Used  Substance and Sexual Activity   Alcohol use: Yes    Alcohol/week: 0.0 standard drinks of alcohol    Comment: rarely   Drug use: No   Sexual activity: Not on file  Other Topics Concern   Not on file  Social History Narrative   Not on file   Social Drivers of Health   Financial Resource Strain: Not on file  Food  Insecurity: Not on file  Transportation Needs: Not on file  Physical Activity: Not on file  Stress: Not on file  Social Connections: Not on file     Review of Systems  Constitutional:  Negative for appetite change and unexpected weight change.  HENT:  Negative for congestion and sinus pressure.   Respiratory:  Negative for cough, chest tightness and shortness of breath.   Cardiovascular:  Negative for chest pain, palpitations and leg swelling.  Gastrointestinal:  Negative for abdominal pain, nausea and vomiting.  Genitourinary:  Negative for difficulty urinating and dysuria.  Musculoskeletal:  Negative for joint swelling and myalgias.  Skin:  Negative for color change and rash.  Neurological:  Negative for dizziness and headaches.  Psychiatric/Behavioral:  Negative for agitation and dysphoric mood.        Objective:     BP 110/70   Pulse 64   Temp 98 F (36.7 C) (Oral)   Ht 5' 4 (1.626 m)   Wt 132 lb 4 oz (60 kg)   LMP 02/02/2009   SpO2 96%   BMI 22.70 kg/m  Wt Readings from Last 3 Encounters:  09/19/24 132  lb 4 oz (60 kg)  08/01/24 136 lb 9.6 oz (62 kg)  07/02/24 135 lb (61.2 kg)    Physical Exam Vitals reviewed.  Constitutional:      General: She is not in acute distress.    Appearance: Normal appearance.  HENT:     Head: Normocephalic and atraumatic.     Right Ear: External ear normal.     Left Ear: External ear normal.     Mouth/Throat:     Pharynx: No oropharyngeal exudate or posterior oropharyngeal erythema.  Eyes:     General: No scleral icterus.       Right eye: No discharge.        Left eye: No discharge.     Conjunctiva/sclera: Conjunctivae normal.  Neck:     Thyroid : No thyromegaly.  Cardiovascular:     Rate and Rhythm: Normal rate and regular rhythm.  Pulmonary:     Effort: No respiratory distress.     Breath sounds: Normal breath sounds. No wheezing.  Abdominal:     General: Bowel sounds are normal.     Palpations: Abdomen is soft.      Tenderness: There is no abdominal tenderness.  Musculoskeletal:        General: No swelling or tenderness.     Cervical back: Neck supple. No tenderness.  Lymphadenopathy:     Cervical: No cervical adenopathy.  Skin:    Findings: No erythema or rash.  Neurological:     Mental Status: She is alert.  Psychiatric:        Mood and Affect: Mood normal.        Behavior: Behavior normal.         Outpatient Encounter Medications as of 09/19/2024  Medication Sig   ferrous sulfate 325 (65 FE) MG tablet Take 325 mg by mouth daily.   levothyroxine  (SYNTHROID ) 50 MCG tablet Take 1 tablet (50 mcg total) by mouth daily before breakfast.   pantoprazole  (PROTONIX ) 40 MG tablet Take 1 tablet (40 mg total) by mouth daily. (Patient not taking: Reported on 08/01/2024)   [DISCONTINUED] levothyroxine  (SYNTHROID ) 50 MCG tablet TAKE 1 TABLET BY MOUTH ONCE DAILY BEFORE BREAKFAST   [DISCONTINUED] MAGNESIUM GLUCONATE PO Take 2 capsules by mouth daily in the afternoon. (Patient not taking: Reported on 08/01/2024)   [DISCONTINUED] Multiple Vitamins-Minerals (PRESERVISION AREDS PO) Take by mouth daily. (Patient not taking: Reported on 08/01/2024)   No facility-administered encounter medications on file as of 09/19/2024.     Lab Results  Component Value Date   WBC 4.3 09/17/2024   HGB 14.2 09/17/2024   HCT 42.2 09/17/2024   PLT 164.0 09/17/2024   GLUCOSE 81 09/17/2024   CHOL 275 (H) 09/17/2024   TRIG 70.0 09/17/2024   HDL 69.90 09/17/2024   LDLDIRECT 147.0 06/08/2013   LDLCALC 191 (H) 09/17/2024   ALT 12 09/17/2024   AST 16 09/17/2024   NA 139 09/17/2024   K 4.5 09/17/2024   CL 104 09/17/2024   CREATININE 0.92 09/17/2024   BUN 14 09/17/2024   CO2 28 09/17/2024   TSH 3.12 09/17/2024   INR 1.0 08/10/2013       Assessment & Plan:  Flu vaccine need -     Flu vaccine trivalent PF, 6mos and older(Flulaval,Afluria,Fluarix,Fluzone)  Visit for screening mammogram -     3D Screening Mammogram, Left and  Right; Future  Hypercholesterolemia Assessment & Plan: The 10-year ASCVD risk score (Arnett DK, et al., 2019) is: 4.5%   Values used to calculate the  score:     Age: 13 years     Clincally relevant sex: Female     Is Non-Hispanic African American: No     Diabetic: No     Tobacco smoker: No     Systolic Blood Pressure: 110 mmHg     Is BP treated: No     HDL Cholesterol: 69.9 mg/dL     Total Cholesterol: 275 mg/dL  Low cholesterol diet and exercise.  Follow lipid panel.   Orders: -     Lipid panel; Future -     Basic metabolic panel with GFR; Future -     Hepatic function panel; Future  Anemia, unspecified type Assessment & Plan: Continue to hold on donating blood.  Follow cbc.    Screening for osteoporosis -     DG Bone Density; Future  SVT (supraventricular tachycardia) Assessment & Plan: Dr Gollan 07/2024 - stay hydrated. She has Apple Watch, recommend she use this to monitor her symptoms and for arrhythmia using her iPhone - For worsening near-syncope associated with SVT, could consider evaluation by EP for ablation versus medical therapy   Lung nodule Assessment & Plan: Had f/u  CT scan 05/30/24 - stable 4mm left lower lobe nodule. Also noted several additional 3-65mm nodules. Radiology recommended f/u CT 12 months.    Hypothyroidism, unspecified type Assessment & Plan: On thyroid  replacement. Follow tsh.    History of colonic polyps Assessment & Plan: Colonoscopy 08/21/20 - diverticulosis and internal hemorrhoids    Other orders -     Levothyroxine  Sodium; Take 1 tablet (50 mcg total) by mouth daily before breakfast.  Dispense: 90 tablet; Refill: 3     Allena Hamilton, MD

## 2024-09-30 ENCOUNTER — Encounter: Payer: Self-pay | Admitting: Internal Medicine

## 2024-09-30 NOTE — Assessment & Plan Note (Signed)
 Continue to hold on donating blood. Follow cbc.

## 2024-09-30 NOTE — Assessment & Plan Note (Signed)
 Dr Gollan 07/2024 - stay hydrated. She has Apple Watch, recommend she use this to monitor her symptoms and for arrhythmia using her iPhone - For worsening near-syncope associated with SVT, could consider evaluation by EP for ablation versus medical therapy

## 2024-09-30 NOTE — Assessment & Plan Note (Signed)
 On thyroid replacement.  Follow tsh.

## 2024-09-30 NOTE — Assessment & Plan Note (Signed)
Colonoscopy 08/21/20 - diverticulosis and internal hemorrhoids

## 2024-09-30 NOTE — Assessment & Plan Note (Signed)
 The 10-year ASCVD risk score (Arnett DK, et al., 2019) is: 4.5%   Values used to calculate the score:     Age: 63 years     Clincally relevant sex: Female     Is Non-Hispanic African American: No     Diabetic: No     Tobacco smoker: No     Systolic Blood Pressure: 110 mmHg     Is BP treated: No     HDL Cholesterol: 69.9 mg/dL     Total Cholesterol: 275 mg/dL  Low cholesterol diet and exercise.  Follow lipid panel.

## 2024-09-30 NOTE — Assessment & Plan Note (Signed)
 Had f/u  CT scan 05/30/24 - stable 4mm left lower lobe nodule. Also noted several additional 3-73mm nodules. Radiology recommended f/u CT 12 months.

## 2024-10-31 ENCOUNTER — Encounter

## 2024-10-31 ENCOUNTER — Other Ambulatory Visit

## 2024-11-28 ENCOUNTER — Ambulatory Visit
Admission: RE | Admit: 2024-11-28 | Discharge: 2024-11-28 | Disposition: A | Discharge status: Home / Self Care | Source: Ambulatory Visit | Attending: Internal Medicine | Admitting: Internal Medicine

## 2024-11-28 ENCOUNTER — Ambulatory Visit: Payer: Self-pay | Admitting: Internal Medicine

## 2024-11-28 DIAGNOSIS — Z1231 Encounter for screening mammogram for malignant neoplasm of breast: Secondary | ICD-10-CM | POA: Diagnosis present

## 2024-11-28 DIAGNOSIS — M810 Age-related osteoporosis without current pathological fracture: Secondary | ICD-10-CM | POA: Insufficient documentation

## 2024-11-28 DIAGNOSIS — Z13820 Encounter for screening for osteoporosis: Secondary | ICD-10-CM | POA: Diagnosis present

## 2024-11-30 NOTE — Telephone Encounter (Signed)
Mychart message sent to pt with recommendations.

## 2024-12-06 NOTE — Telephone Encounter (Signed)
 APPT SCHEDULED

## 2024-12-06 NOTE — Addendum Note (Signed)
 Addended by: GLENDIA ALLENA RAMAN on: 12/06/2024 02:17 AM   Modules accepted: Orders

## 2024-12-06 NOTE — Telephone Encounter (Signed)
 I have placed the order for vitamin D level to be checked. Ok to schedule for vitamin D lab check. Will need to make note on lab - vitamin D lab only.

## 2024-12-07 ENCOUNTER — Telehealth: Payer: Self-pay | Admitting: Internal Medicine

## 2024-12-07 NOTE — Telephone Encounter (Signed)
 Left VM and MyChart to let pt know 12/10/24 lab visit has been canceled due to inclement weather.   [id number], please help reschedule pt if she calls back -kh

## 2024-12-10 ENCOUNTER — Other Ambulatory Visit

## 2024-12-12 ENCOUNTER — Other Ambulatory Visit (INDEPENDENT_AMBULATORY_CARE_PROVIDER_SITE_OTHER)

## 2024-12-12 DIAGNOSIS — M810 Age-related osteoporosis without current pathological fracture: Secondary | ICD-10-CM | POA: Diagnosis not present

## 2024-12-13 ENCOUNTER — Ambulatory Visit: Payer: Self-pay | Admitting: Internal Medicine

## 2024-12-13 LAB — VITAMIN D 25 HYDROXY (VIT D DEFICIENCY, FRACTURES): VITD: 24.8 ng/mL — ABNORMAL LOW (ref 30.00–100.00)

## 2024-12-31 ENCOUNTER — Encounter: Admitting: Internal Medicine

## 2025-03-18 ENCOUNTER — Other Ambulatory Visit

## 2025-03-20 ENCOUNTER — Ambulatory Visit: Admitting: Internal Medicine
